# Patient Record
Sex: Male | Born: 1975 | Race: Black or African American | Hispanic: No | Marital: Single | Smoking: Former smoker
Health system: Southern US, Community
[De-identification: ages and names within clinical notes are randomized; demographics above are authoritative.]

## PROBLEM LIST (undated history)

## (undated) DIAGNOSIS — G4730 Sleep apnea, unspecified: Secondary | ICD-10-CM

## (undated) DIAGNOSIS — E785 Hyperlipidemia, unspecified: Secondary | ICD-10-CM

## (undated) DIAGNOSIS — K219 Gastro-esophageal reflux disease without esophagitis: Secondary | ICD-10-CM

## (undated) DIAGNOSIS — T7840XA Allergy, unspecified, initial encounter: Secondary | ICD-10-CM

## (undated) DIAGNOSIS — IMO0001 Reserved for inherently not codable concepts without codable children: Secondary | ICD-10-CM

## (undated) DIAGNOSIS — I10 Essential (primary) hypertension: Secondary | ICD-10-CM

## (undated) DIAGNOSIS — M1990 Unspecified osteoarthritis, unspecified site: Secondary | ICD-10-CM

## (undated) DIAGNOSIS — Z862 Personal history of diseases of the blood and blood-forming organs and certain disorders involving the immune mechanism: Secondary | ICD-10-CM

## (undated) DIAGNOSIS — R0602 Shortness of breath: Secondary | ICD-10-CM

## (undated) DIAGNOSIS — F419 Anxiety disorder, unspecified: Secondary | ICD-10-CM

## (undated) DIAGNOSIS — R51 Headache: Secondary | ICD-10-CM

## (undated) DIAGNOSIS — F329 Major depressive disorder, single episode, unspecified: Secondary | ICD-10-CM

## (undated) DIAGNOSIS — F32A Depression, unspecified: Secondary | ICD-10-CM

## (undated) HISTORY — DX: Essential (primary) hypertension: I10

## (undated) HISTORY — DX: Hyperlipidemia, unspecified: E78.5

## (undated) HISTORY — DX: Reserved for inherently not codable concepts without codable children: IMO0001

## (undated) HISTORY — DX: Major depressive disorder, single episode, unspecified: F32.9

## (undated) HISTORY — DX: Gastro-esophageal reflux disease without esophagitis: K21.9

## (undated) HISTORY — DX: Depression, unspecified: F32.A

## (undated) HISTORY — DX: Allergy, unspecified, initial encounter: T78.40XA

---

## 1999-03-26 ENCOUNTER — Emergency Department (HOSPITAL_COMMUNITY): Admission: EM | Admit: 1999-03-26 | Discharge: 1999-03-26 | Payer: Self-pay | Admitting: *Deleted

## 1999-03-27 ENCOUNTER — Encounter: Payer: Self-pay | Admitting: *Deleted

## 2000-10-31 ENCOUNTER — Encounter: Payer: Self-pay | Admitting: Emergency Medicine

## 2000-10-31 ENCOUNTER — Emergency Department (HOSPITAL_COMMUNITY): Admission: EM | Admit: 2000-10-31 | Discharge: 2000-10-31 | Payer: Self-pay | Admitting: Emergency Medicine

## 2004-06-19 ENCOUNTER — Emergency Department (HOSPITAL_COMMUNITY): Admission: EM | Admit: 2004-06-19 | Discharge: 2004-06-19 | Payer: Self-pay | Admitting: Family Medicine

## 2006-01-29 ENCOUNTER — Emergency Department (HOSPITAL_COMMUNITY): Admission: EM | Admit: 2006-01-29 | Discharge: 2006-01-29 | Payer: Self-pay | Admitting: Emergency Medicine

## 2006-04-10 ENCOUNTER — Emergency Department (HOSPITAL_COMMUNITY): Admission: EM | Admit: 2006-04-10 | Discharge: 2006-04-10 | Payer: Self-pay | Admitting: Emergency Medicine

## 2007-02-19 ENCOUNTER — Emergency Department (HOSPITAL_COMMUNITY): Admission: EM | Admit: 2007-02-19 | Discharge: 2007-02-19 | Payer: Self-pay | Admitting: Family Medicine

## 2007-04-11 ENCOUNTER — Emergency Department (HOSPITAL_COMMUNITY): Admission: EM | Admit: 2007-04-11 | Discharge: 2007-04-11 | Payer: Self-pay | Admitting: Emergency Medicine

## 2008-09-01 ENCOUNTER — Emergency Department (HOSPITAL_COMMUNITY): Admission: EM | Admit: 2008-09-01 | Discharge: 2008-09-01 | Payer: Self-pay | Admitting: Family Medicine

## 2009-10-09 ENCOUNTER — Ambulatory Visit: Payer: Self-pay | Admitting: Internal Medicine

## 2009-10-09 ENCOUNTER — Observation Stay (HOSPITAL_COMMUNITY): Admission: EM | Admit: 2009-10-09 | Discharge: 2009-10-10 | Payer: Self-pay | Admitting: Emergency Medicine

## 2009-10-14 ENCOUNTER — Encounter: Payer: Self-pay | Admitting: Ophthalmology

## 2009-10-14 ENCOUNTER — Ambulatory Visit: Payer: Self-pay | Admitting: Internal Medicine

## 2009-10-14 DIAGNOSIS — Z862 Personal history of diseases of the blood and blood-forming organs and certain disorders involving the immune mechanism: Secondary | ICD-10-CM | POA: Insufficient documentation

## 2009-10-14 DIAGNOSIS — Z8639 Personal history of other endocrine, nutritional and metabolic disease: Secondary | ICD-10-CM

## 2009-10-14 DIAGNOSIS — R0789 Other chest pain: Secondary | ICD-10-CM | POA: Insufficient documentation

## 2009-10-14 DIAGNOSIS — K219 Gastro-esophageal reflux disease without esophagitis: Secondary | ICD-10-CM

## 2009-10-14 DIAGNOSIS — F1910 Other psychoactive substance abuse, uncomplicated: Secondary | ICD-10-CM | POA: Insufficient documentation

## 2009-10-14 LAB — CONVERTED CEMR LAB
Calcium: 9.7 mg/dL (ref 8.4–10.5)
Glucose, Bld: 91 mg/dL (ref 70–99)
Sodium: 141 meq/L (ref 135–145)

## 2010-03-04 NOTE — Assessment & Plan Note (Signed)
Summary: HFU/DS   Vital Signs:  Patient profile:   35 year old male Height:      71 inches (180.34 cm) Weight:      189.3 pounds (86.05 kg) BMI:     26.50 Temp:     98.7 degrees F (37.06 degrees C) oral Pulse rate:   65 / minute BP sitting:   131 / 81  (right arm) Cuff size:   regular  Vitals Entered By: Theotis Barrio NT II (October 14, 2009 3:54 PM) CC: ADD ON-ER FOLLOW UP/ NKA Is Patient Diabetic? No Pain Assessment Patient in pain? no       Have you ever been in a relationship where you felt threatened, hurt or afraid?No   Does patient need assistance? Functional Status Self care Ambulation Normal   CC:  ADD ON-ER FOLLOW UP/ NKA.  History of Present Illness: This is a 35 year old male recently hospitalized (10/09/09) for chest pain.  He has had not further recurrence of chest pain.  Pt is now complaining of irritation in his throat that makes him cough. Pt states that symptoms have been occurring for three years. Pt gets sensation to cough and he occasionally coughs up white sputum tinged with blood. This happens almost daily.  Worse after he eats and at night when he lays down.  This is associated with heart burn.  These symptoms are associated with nausea and pt vomits a few times per week.  Pt states that there are streaks of blood in vomit.   Pt states that he drinks 2-3 beers 2-3 times weekly but will occasionally drink up to a 6 pack.  Pt states that he is taking prilosec daily and is not missing doses.  At this visit we discussed the patients alcohol use and the importance of quitting.   Preventive Screening-Counseling & Management  Alcohol-Tobacco     Alcohol drinks/day: 2     Alcohol type: beer     Smoking Status: current     Packs/Day: 2-3 CIGARETTES  Caffeine-Diet-Exercise     Does Patient Exercise: yes     Type of exercise: WEIGHT LIFTING     Times/week: <3  Allergies (verified): No Known Drug Allergies  Family History: Dad had pancreatic  cancer Brother with CAD had MI at 73's Mother had aneurism at 54  Social History: Lives with significant other. Has 4 children. Smokes 2-3 cigs daily. Drings 2-3 beverages 2-3 times weekly. No illigal drug use now. Smoking Status:  current Packs/Day:  2-3 CIGARETTES Does Patient Exercise:  yes  Review of Systems       No change in BM's, no abdominal pain, no chest pain, no SoB, no fevers or chills.   Physical Exam  General:  alert and well-developed.   Head:  normocephalic and atraumatic.   Eyes:  vision grossly intact, pupils equal, and pupils round.   Ears:  R ear normal and L ear normal.   Nose:  no external deformity and no nasal discharge.   Mouth:  good dentition and pharynx pink and moist.  Mild erythema in posterior pharynx Neck:  supple and full ROM.   Chest Wall:  no deformities and no tenderness.   Lungs:  normal respiratory effort, normal breath sounds, no dullness, no fremitus, no crackles, and no wheezes.   Heart:  normal rate, regular rhythm, no murmur, no gallop, and no rub.   Abdomen:  soft, non-tender, normal bowel sounds, and no distention.   Msk:  normal ROM, no  joint tenderness, and no joint swelling.   Pulses:  +2 radial and dp/pt pulses bilaterally Extremities:  No edema Neurologic:  alert & oriented X3 and cranial nerves II-XII intact.   Skin:  no rashes and no edema.   Cervical Nodes:  no cervical lymphadanopathy   Impression & Recommendations:  Problem # 1:  Hosp for CHEST PAIN, ATYPICAL (ICD-786.59) Was felt to be most consistent with GERD/alcohol use.  Pt has hx of GERD and had not been taking his medications regularly at the time of hospitalization.  On discharge pt was told to start Zantac OTC and has been taking prilosec daily since that point.  At this point the chest pain has resolved, but we will continue to monitor.  Problem # 2:  Hx of GERD (ICD-530.81) Pt was instructed on the importance of regular use of his medications. He was instructed  on lifestyle modifications to help in the control of GERD symptoms such as elevation of the head of his bed, remaining upright for 3 hours after meals, decreasing alcohol, cigarette, and caffeine use, and avoidance of spicy or acidic foods.  The patients history of coughing up blood streaked sputum is likely the result of his uncontrolled gerd.  I increased his dose of OTC prilosec from 1 20mg  tab daily to two.  The patient will follow up with his PCP in 1 month for FU.  If the patient remains adherent to his medication use, and symptoms do not improve, I would consider a GI consult.  The pt was instructed to call the [facility] or go to the ED if symptoms get worse.   I also discussed the importance of not drinking alcohol because of the risk of alcoholic gastritis and worsening of his GERD symptoms.   His updated medication list for this problem includes:    Prilosec Otc 20 Mg Tbec (Omeprazole magnesium) .Marland Kitchen... Take two tablets once daily.  Problem # 3:  HYPOKALEMIA, HX OF (ICD-V12.2) Rechecking BMET today.   Orders: T-Basic Metabolic Panel ([id number])  Problem # 4:  Hx of SUBSTANCE ABUSE, MULTIPLE (ICD-305.90) Pt currently denies any illegal drug use and was counseled on the negative effects of both the use of illicit drugs and of alcohol.  Complete Medication List: 1)  Prilosec Otc 20 Mg Tbec (Omeprazole magnesium) .... Take two tablets once daily. 2)  Multivitamins Tabs (Multiple vitamin) .... Take 1 tablet by mouth once a day  Patient Instructions: 1)  Please take two tablets of your over the counter prilosec once a day.  Do not miss any doses.  If your symptoms get worse or you begin to cough up more than blood streaks, please call the outpatient clinic or go to the ER.  You will follow up in 1 month.  Process Orders Check Orders Results:     [facility]: ABN not required for this insurance Tests Sent for requisitioning (October 14, 2009 5:09 PM):     10/14/2009:  [facility] -- T-Basic Metabolic Panel [phone] (signed)

## 2010-03-04 NOTE — Miscellaneous (Signed)
Summary: PATIENT CONSENT FORM  PATIENT CONSENT FORM   Imported By: Louretta Parma 10/15/2009 11:30:36  _____________________________________________________________________  External Attachment:    Type:   Image     Comment:   External Document

## 2010-04-15 LAB — COMPREHENSIVE METABOLIC PANEL
ALT: 21 U/L (ref 0–53)
Alkaline Phosphatase: 68 U/L (ref 39–117)
BUN: 8 mg/dL (ref 6–23)
CO2: 21 mEq/L (ref 19–32)
Chloride: 104 mEq/L (ref 96–112)
GFR calc non Af Amer: >60 mL/min (ref >60)
Glucose, Bld: 132 mg/dL — ABNORMAL HIGH (ref 70–99)
Potassium: 2.9 mEq/L — ABNORMAL LOW (ref 3.5–5.1)
Total Bilirubin: 0.3 mg/dL (ref 0.3–1.2)
Total Protein: 7.8 g/dL (ref 6.0–8.3)

## 2010-04-15 LAB — BASIC METABOLIC PANEL
BUN: 10 mg/dL (ref 6–23)
CO2: 30 mEq/L (ref 19–32)
Chloride: 103 mEq/L (ref 96–112)
Creatinine, Ser: 1.24 mg/dL (ref 0.4–1.5)
Potassium: 4 mEq/L (ref 3.5–5.1)

## 2010-04-15 LAB — DIFFERENTIAL
Eosinophils Absolute: 0.1 10*3/uL (ref 0.0–0.7)
Lymphs Abs: 4 10*3/uL (ref 0.7–4.0)
Monocytes Absolute: 1.1 10*3/uL — ABNORMAL HIGH (ref 0.1–1.0)
Neutrophils Relative %: 56 % (ref 43–77)

## 2010-04-15 LAB — POCT CARDIAC MARKERS
CKMB, poc: <1 ng/mL — ABNORMAL LOW (ref 1.0–8.0)
CKMB, poc: <1 ng/mL — ABNORMAL LOW (ref 1.0–8.0)
CKMB, poc: <1 ng/mL — ABNORMAL LOW (ref 1.0–8.0)
CKMB, poc: <1 ng/mL — ABNORMAL LOW (ref 1.0–8.0)
Myoglobin, poc: 118 ng/mL (ref 12–200)
Myoglobin, poc: 121 ng/mL (ref 12–200)
Myoglobin, poc: 134 ng/mL (ref 12–200)
Myoglobin, poc: 83.4 ng/mL (ref 12–200)
Troponin i, poc: <0.05 ng/mL (ref 0.00–0.09)
Troponin i, poc: <0.05 ng/mL (ref 0.00–0.09)
Troponin i, poc: <0.05 ng/mL (ref 0.00–0.09)
Troponin i, poc: <0.05 ng/mL (ref 0.00–0.09)

## 2010-04-15 LAB — URINALYSIS, ROUTINE W REFLEX MICROSCOPIC
Ketones, ur: NEGATIVE mg/dL
Nitrite: NEGATIVE
Specific Gravity, Urine: 1.006 (ref 1.005–1.030)
Urobilinogen, UA: 0.2 mg/dL (ref 0.0–1.0)
pH: 5.5 (ref 5.0–8.0)

## 2010-04-15 LAB — ETHANOL

## 2010-04-15 LAB — LIPID PANEL: HDL: 56 mg/dL (ref >39)

## 2010-04-15 LAB — LIPASE, BLOOD: Lipase: 20 U/L (ref 11–59)

## 2010-04-15 LAB — CARDIAC PANEL(CRET KIN+CKTOT+MB+TROPI)
Relative Index: 0.3 (ref 0.0–2.5)
Total CK: 494 U/L — ABNORMAL HIGH (ref 7–232)

## 2010-04-15 LAB — CBC
HCT: 39.6 % (ref 39.0–52.0)
Hemoglobin: 13.1 g/dL (ref 13.0–17.0)
MCHC: 33.1 g/dL (ref 30.0–36.0)
RBC: 6.16 MIL/uL — ABNORMAL HIGH (ref 4.22–5.81)

## 2010-04-15 LAB — RAPID URINE DRUG SCREEN, HOSP PERFORMED: Benzodiazepines: NOT DETECTED

## 2010-04-15 LAB — TSH: TSH: 2.3 u[IU]/mL (ref 0.350–4.500)

## 2012-06-28 ENCOUNTER — Encounter: Payer: Self-pay | Admitting: Internal Medicine

## 2012-06-28 ENCOUNTER — Ambulatory Visit: Payer: No Typology Code available for payment source | Attending: Internal Medicine | Admitting: Internal Medicine

## 2012-06-28 ENCOUNTER — Telehealth: Payer: Self-pay | Admitting: *Deleted

## 2012-06-28 VITALS — BP 144/99 | HR 85 | Temp 98.5°F | Resp 18 | Ht 69.5 in | Wt 180.0 lb

## 2012-06-28 DIAGNOSIS — M5137 Other intervertebral disc degeneration, lumbosacral region: Secondary | ICD-10-CM | POA: Insufficient documentation

## 2012-06-28 DIAGNOSIS — K219 Gastro-esophageal reflux disease without esophagitis: Secondary | ICD-10-CM

## 2012-06-28 DIAGNOSIS — I10 Essential (primary) hypertension: Secondary | ICD-10-CM

## 2012-06-28 DIAGNOSIS — Z0000 Encounter for general adult medical examination without abnormal findings: Secondary | ICD-10-CM | POA: Insufficient documentation

## 2012-06-28 DIAGNOSIS — F17200 Nicotine dependence, unspecified, uncomplicated: Secondary | ICD-10-CM | POA: Insufficient documentation

## 2012-06-28 DIAGNOSIS — Z79899 Other long term (current) drug therapy: Secondary | ICD-10-CM | POA: Insufficient documentation

## 2012-06-28 DIAGNOSIS — M549 Dorsalgia, unspecified: Secondary | ICD-10-CM

## 2012-06-28 DIAGNOSIS — F3289 Other specified depressive episodes: Secondary | ICD-10-CM

## 2012-06-28 DIAGNOSIS — G8929 Other chronic pain: Secondary | ICD-10-CM | POA: Insufficient documentation

## 2012-06-28 DIAGNOSIS — F329 Major depressive disorder, single episode, unspecified: Secondary | ICD-10-CM | POA: Insufficient documentation

## 2012-06-28 DIAGNOSIS — M51379 Other intervertebral disc degeneration, lumbosacral region without mention of lumbar back pain or lower extremity pain: Secondary | ICD-10-CM | POA: Insufficient documentation

## 2012-06-28 MED ORDER — AMLODIPINE BESYLATE 5 MG PO TABS: 5.0000 mg | ORAL_TABLET | Freq: Every day | ORAL | Status: DC

## 2012-06-28 MED ORDER — OMEPRAZOLE 20 MG PO CPDR: 20.0000 mg | DELAYED_RELEASE_CAPSULE | Freq: Every day | ORAL | Status: DC

## 2012-06-28 MED ORDER — GABAPENTIN 300 MG PO CAPS: 300.0000 mg | ORAL_CAPSULE | Freq: Three times a day (TID) | ORAL | Status: DC

## 2012-06-28 NOTE — Telephone Encounter (Signed)
Patient has orders for MRI Lumbar.

## 2012-06-28 NOTE — Progress Notes (Signed)
Patient states he has been diagnosed with hypertension, depression, a "back disorder", and acid reflux.

## 2012-06-28 NOTE — Progress Notes (Signed)
Patient ID: Christopher Mcintyre, male   DOB: 1975-11-19, 37 y.o.   MRN: [id number] Patient Demographics  Christopher Mcintyre, is a 37 y.o. male  WUJ:[id number]  [id number]  DOB - [birth year]  Chief Complaint  Patient presents with  . Establish Care    patient presents to establish care; needs hypertension med refills.  . Hypertension        Subjective:   Christopher Mcintyre today is here to establish primary care. Patient has a  Past Medical History  Diagnosis Date  . Hypertension   . Allergy   . Depression   . Reflux   . Patient has had chronic back pain for the past 3-4 years, he has had numerous x-rays in the past which shows degenerative joint disease he did he continues to have back pain. He's not taken any antihypertensive medications or anti-psychiatric medications for the past few years as well. His primary purpose of today's visit is to establish care. He previously used to see a Therapist, sports at [employer]. He currently denies any suicidal or homicidal ideations. Patient has also has No headache, No chest pain, No abdominal pain,No Nausea, No new weakness tingling or numbness, No Cough or SOB. No history of urinary incontinence, weakness.  Objective:    Filed Vitals:   06/28/12 1331  BP: 144/99  Pulse: 85  Temp: 98.5 F (36.9 C)  TempSrc: Oral  Resp: 18  Height: 5' 9.5" (1.765 m)  Weight: 180 lb (81.647 kg)  SpO2: 98%     ALLERGIES:  No Known Allergies  PAST MEDICAL HISTORY: Past Medical History  Diagnosis Date  . Hypertension   . Allergy   . Depression   . Reflux     PAST SURGICAL HISTORY: History reviewed. No pertinent past surgical history.  FAMILY HISTORY: Family History  Problem Relation Age of Onset  . Cancer Mother   . Diabetes Mother   . Heart disease Mother   . Hyperlipidemia Mother   . Cancer Father   . Diabetes Father   . Heart disease Father   . Hyperlipidemia Father     MEDICATIONS AT HOME: Prior to Admission medications   Medication  Sig Start Date End Date Taking? Authorizing Provider  gabapentin (NEURONTIN) 300 MG capsule Take 1 capsule (300 mg total) by mouth 3 (three) times daily. 06/28/12  Yes Ziere Docken Levora Dredge, MD  hydrochlorothiazide (HYDRODIURIL) 25 MG tablet Take 25 mg by mouth daily.   Yes Historical Provider, MD  HYDROcodone-acetaminophen (VICODIN) 5-500 MG per tablet Take 1 tablet by mouth every 6 (six) hours as needed for pain.   Yes Historical Provider, MD  omeprazole (PRILOSEC) 20 MG capsule Take 1 capsule (20 mg total) by mouth daily. 06/28/12  Yes Christopher Mcintyre Levora Dredge, MD  QUEtiapine (SEROQUEL) 100 MG tablet Take 100 mg by mouth at bedtime.   Yes Historical Provider, MD  sertraline (ZOLOFT) 100 MG tablet Take 100 mg by mouth daily.   Yes Historical Provider, MD  traMADol (ULTRAM) 50 MG tablet Take 50 mg by mouth every 6 (six) hours as needed for pain.   Yes Historical Provider, MD  traZODone (DESYREL) 100 MG tablet Take 100 mg by mouth at bedtime.   Yes Historical Provider, MD  amLODipine (NORVASC) 5 MG tablet Take 1 tablet (5 mg total) by mouth daily. 06/28/12   Zayah Keilman Levora Dredge, MD    SOCIAL HISTORY:   reports that he has been smoking Cigarettes.  He has been smoking about 0.25 packs per day. He does not have  any smokeless tobacco history on file. He reports that he does not drink alcohol or use illicit drugs.  REVIEW OF SYSTEMS:  Constitutional:   No   Fevers, chills, fatigue.  HEENT:    No headaches, Sore throat,   Cardio-vascular: No chest pain,  Orthopnea, swelling in lower extremities, anasarca, palpitations  GI:  No abdominal pain, nausea, vomiting, diarrhea  Resp: No shortness of breath,  No coughing up of blood.No cough.No wheezing.  Skin:  no rash or lesions.  GU:  no dysuria, change in color of urine, no urgency or frequency.  No flank pain.  Musculoskeletal: No joint pain or swelling.  No decreased range of motion.    Psych: No change in mood or affect. No depression or anxiety.   No memory loss.   Exam  General appearance :Awake, alert, not in any distress. Speech Clear. Not toxic Looking HEENT: Atraumatic and Normocephalic, pupils equally reactive to light and accomodation Neck: supple, no JVD. No cervical lymphadenopathy.  Chest:Good air entry bilaterally, no added sounds  CVS: S1 S2 regular, no murmurs.  Abdomen: Bowel sounds present, Non tender and not distended with no gaurding, rigidity or rebound. Extremities: B/L Lower Ext shows no edema, both legs are warm to touch Neurology: Awake alert, and oriented X 3, CN II-XII intact, Non focal Skin:No Rash Wounds:N/A    Data Review   CBC No results found for this basename: WBC, HGB, HCT, PLT, MCV, MCH, MCHC, RDW, NEUTRABS, LYMPHSABS, MONOABS, EOSABS, BASOSABS, BANDABS, BANDSABD,  in the last 168 hours  Chemistries   No results found for this basename: NA, K, CL, CO2, GLUCOSE, BUN, CREATININE, GFRCGP, CALCIUM, MG, AST, ALT, ALKPHOS, BILITOT,  in the last 168 hours ------------------------------------------------------------------------------------------------------------------ No results found for this basename: HGBA1C,  in the last 72 hours ------------------------------------------------------------------------------------------------------------------ No results found for this basename: CHOL, HDL, LDLCALC, TRIG, CHOLHDL, LDLDIRECT,  in the last 72 hours ------------------------------------------------------------------------------------------------------------------ No results found for this basename: TSH, T4TOTAL, FREET3, T3FREE, THYROIDAB,  in the last 72 hours ------------------------------------------------------------------------------------------------------------------ No results found for this basename: VITAMINB12, FOLATE, FERRITIN, TIBC, IRON, RETICCTPCT,  in the last 72 hours  Coagulation profile  No results found for this basename: INR, PROTIME,  in the last 168 hours    Assessment & Plan    #1 hypertension - Start amlodipine 5 mg daily - Followup blood pressure in one month  #2 gastroesophageal reflux disease - Start omeprazole- monitor and follow clinically  #3 depression - Referral to psychiatry - Defer starting any psych meds for psych-we'll also check a urinary drug screen as well - Reassess next visit - Currently stable without any suicidal or homicidal ideation  #4 chronic back pain - And long-standing history and prior x-rays that he has with him which show degenerative disc disease- check MRI of the lumbosacral spine - Explained to him that, we will not do chronic pain management clinic - Follow MRI and monitor clinically - Exam is reassuring with 5/5 strength all over, no history of urinary incontinence, fever weakness -? Prior history of drug use-check urinary drug screen  We'll check CBC, CMET, A1c and lipid panel and bring back in one month Please check MRI during next visit as well

## 2012-07-05 ENCOUNTER — Ambulatory Visit: Payer: No Typology Code available for payment source | Attending: Family Medicine

## 2012-07-05 DIAGNOSIS — M549 Dorsalgia, unspecified: Secondary | ICD-10-CM

## 2012-07-05 DIAGNOSIS — I10 Essential (primary) hypertension: Secondary | ICD-10-CM

## 2012-07-05 LAB — LIPID PANEL
Cholesterol: 149 mg/dL (ref 0–200)
HDL: 55 mg/dL (ref >39)
Total CHOL/HDL Ratio: 2.7 Ratio
Triglycerides: 73 mg/dL (ref <150)
VLDL: 15 mg/dL (ref 0–40)

## 2012-07-05 LAB — COMPREHENSIVE METABOLIC PANEL
Alkaline Phosphatase: 74 U/L (ref 39–117)
CO2: 23 mEq/L (ref 19–32)
Creat: 1.11 mg/dL (ref 0.50–1.35)
Glucose, Bld: 96 mg/dL (ref 70–99)
Sodium: 135 mEq/L (ref 135–145)
Total Bilirubin: 0.3 mg/dL (ref 0.3–1.2)
Total Protein: 6.7 g/dL (ref 6.0–8.3)

## 2012-07-05 LAB — CBC
HCT: 37.1 % — ABNORMAL LOW (ref 39.0–52.0)
MCHC: 31.5 g/dL (ref 30.0–36.0)
MCV: 61.1 fL — ABNORMAL LOW (ref 78.0–100.0)
Platelets: 269 10*3/uL (ref 150–400)
RDW: 21.7 % — ABNORMAL HIGH (ref 11.5–15.5)
WBC: 5.8 10*3/uL (ref 4.0–10.5)

## 2012-07-05 LAB — TSH: TSH: 1.726 u[IU]/mL (ref 0.350–4.500)

## 2012-07-10 ENCOUNTER — Telehealth: Payer: Self-pay | Admitting: Family Medicine

## 2012-07-10 NOTE — Telephone Encounter (Signed)
Pt having side effects from amLODipine (NORVASC) 5 MG tablet.  Skin breaking out in rashes, no energy.

## 2012-07-10 NOTE — Telephone Encounter (Signed)
07/10/12 Spoke with patient via telephone stated his skin is is breaking out  With a rash x 1week. Itching on legs and arms at intervals also feeling weak but  Can function. No other symptoms. Patient stated  He did not think he needed to be seen  At clinic just wanted to make MD aware.  P.Tinnie Kunin,RN BSN MHA

## 2012-07-10 NOTE — Telephone Encounter (Signed)
If he is having a rash along with systemic sx of feeling weak, he probably should be evaluated in the clinic. We may need to change a medication if he is allergic, and his reaction may need treatment itself.

## 2012-07-11 ENCOUNTER — Telehealth: Payer: Self-pay | Admitting: *Deleted

## 2012-07-11 NOTE — Telephone Encounter (Signed)
07/11/12 Patient schedule for an appointment on Monday 07/16/12 to  Follow up rash. P.Lekia Nier,RN BSN MHA

## 2012-07-12 ENCOUNTER — Ambulatory Visit (HOSPITAL_COMMUNITY)
Admission: RE | Admit: 2012-07-12 | Discharge: 2012-07-12 | Disposition: A | Discharge status: Home / Self Care | Payer: No Typology Code available for payment source | Source: Ambulatory Visit | Attending: Internal Medicine | Admitting: Internal Medicine

## 2012-07-12 DIAGNOSIS — M51379 Other intervertebral disc degeneration, lumbosacral region without mention of lumbar back pain or lower extremity pain: Secondary | ICD-10-CM | POA: Insufficient documentation

## 2012-07-12 DIAGNOSIS — M5146 Schmorl's nodes, lumbar region: Secondary | ICD-10-CM | POA: Insufficient documentation

## 2012-07-12 DIAGNOSIS — I10 Essential (primary) hypertension: Secondary | ICD-10-CM

## 2012-07-12 DIAGNOSIS — M5126 Other intervertebral disc displacement, lumbar region: Secondary | ICD-10-CM | POA: Insufficient documentation

## 2012-07-12 DIAGNOSIS — M549 Dorsalgia, unspecified: Secondary | ICD-10-CM

## 2012-07-12 DIAGNOSIS — M5137 Other intervertebral disc degeneration, lumbosacral region: Secondary | ICD-10-CM | POA: Insufficient documentation

## 2012-07-16 ENCOUNTER — Ambulatory Visit: Payer: No Typology Code available for payment source | Attending: Family Medicine | Admitting: Family Medicine

## 2012-07-16 VITALS — BP 150/93 | HR 72 | Temp 97.9°F | Resp 16 | Ht 69.0 in | Wt 180.0 lb

## 2012-07-16 DIAGNOSIS — M549 Dorsalgia, unspecified: Secondary | ICD-10-CM

## 2012-07-16 DIAGNOSIS — R21 Rash and other nonspecific skin eruption: Secondary | ICD-10-CM | POA: Insufficient documentation

## 2012-07-16 DIAGNOSIS — I10 Essential (primary) hypertension: Secondary | ICD-10-CM

## 2012-07-16 MED ORDER — GABAPENTIN 300 MG PO CAPS: 300.0000 mg | ORAL_CAPSULE | Freq: Every day | ORAL | Status: DC

## 2012-07-16 MED ORDER — LISINOPRIL-HYDROCHLOROTHIAZIDE 10-12.5 MG PO TABS: 1.0000 | ORAL_TABLET | Freq: Every day | ORAL | Status: DC

## 2012-07-16 NOTE — Progress Notes (Signed)
Patient states since he started taking  norvasc he broke out in a rash on his arms and back of knees

## 2012-07-16 NOTE — Patient Instructions (Signed)

## 2012-07-16 NOTE — Progress Notes (Signed)
Subjective:     Patient ID: Christopher Mcintyre, male   DOB: 1975/06/04, 37 y.o.   MRN: [id number]  HPI Pt here with itchy rash on b/l arms since starting amlodipine a few weeks ago. It was also on the backs of his legs but that has resolved and the rash has greatly improved since last Friday when he stopped the amlodipine of his own accord. He has not had any respiratory sx or the feeling like his throat was swelling. He notes that in the past he took hctz but has not been on it since starting the amlodipine.   He also notes that he was given a prescription for neurontin 300mg  TID and took it to the health dept where he was told it would be too expensive.     Review of Systems per hpi     Objective:   Physical Exam  Nursing note and vitals reviewed. Constitutional: He appears well-developed and well-nourished.  Cardiovascular: Normal rate and regular rhythm.   Pulmonary/Chest: Effort normal and breath sounds normal.  Skin: Skin is warm and dry. Rash noted.  Diffuse, slightly bumpy, slightly erythematous rash on posterior side of lower arms       Assessment:     Essential hypertension, benign - Plan: lisinopril-hydrochlorothiazide (PRINZIDE,ZESTORETIC) 10-12.5 MG per tablet  Back pain - Plan: gabapentin (NEURONTIN) 300 MG capsule       Plan:     Rash - agree with d/c amlodipine. Will list under allergies. Will start zestoretic. Ok to take benadryl/claritin or use topical hydrocortsone otc for the itching until the rash clears up.   Back pain/neurontin too expensive - as far as I can tell, the least expensive place to get it is at [facility]. Discussed with him that neurontin is a very good drug for him and i think he really should give it a try. Unfortunately at [facility] only 300mg /day is included in the discount price. While not ideal, this is at least worth a try. Have given him a new script with this dosing.   F/u as scheduled for chronic issues or if rash worsens.

## 2012-07-19 ENCOUNTER — Telehealth: Payer: Self-pay | Admitting: Family Medicine

## 2012-07-24 ENCOUNTER — Telehealth: Payer: Self-pay | Admitting: *Deleted

## 2012-07-24 NOTE — Telephone Encounter (Signed)
07/24/12 Patient will be in on 07/30/12 for follow up appointment will discuss  MRI results with doctor. P.Kaiyla Stahly,RN BSN MHA

## 2012-07-30 ENCOUNTER — Telehealth: Payer: Self-pay | Admitting: Family Medicine

## 2012-07-30 ENCOUNTER — Encounter: Payer: Self-pay | Admitting: Internal Medicine

## 2012-07-30 ENCOUNTER — Ambulatory Visit: Payer: No Typology Code available for payment source | Attending: Family Medicine | Admitting: Internal Medicine

## 2012-07-30 VITALS — BP 160/108 | HR 76 | Temp 98.5°F | Ht 71.0 in | Wt 181.2 lb

## 2012-07-30 DIAGNOSIS — I10 Essential (primary) hypertension: Secondary | ICD-10-CM

## 2012-07-30 DIAGNOSIS — M5126 Other intervertebral disc displacement, lumbar region: Secondary | ICD-10-CM

## 2012-07-30 DIAGNOSIS — M5127 Other intervertebral disc displacement, lumbosacral region: Secondary | ICD-10-CM

## 2012-07-30 DIAGNOSIS — K922 Gastrointestinal hemorrhage, unspecified: Secondary | ICD-10-CM

## 2012-07-30 MED ORDER — LISINOPRIL-HYDROCHLOROTHIAZIDE 20-25 MG PO TABS: 1.0000 | ORAL_TABLET | Freq: Every day | ORAL | Status: DC

## 2012-07-30 MED ORDER — OMEPRAZOLE 20 MG PO CPDR: 40.0000 mg | DELAYED_RELEASE_CAPSULE | Freq: Two times a day (BID) | ORAL | Status: DC

## 2012-07-30 MED ORDER — FLUTICASONE PROPIONATE 50 MCG/ACT NA SUSP: 2.0000 | Freq: Every day | NASAL | Status: DC

## 2012-07-30 NOTE — Telephone Encounter (Signed)
Pt needs Dr to write note stating pt's condition for disability claim. Pt's lawyer told pt that if he has written confirmation of medical condition this would facilitate claim.

## 2012-07-30 NOTE — Telephone Encounter (Signed)
Ask Dr. Butler Denmark her thoughts on this.  She saw him today.

## 2012-07-30 NOTE — Patient Instructions (Addendum)
For the problem with vomiting blood, avoid aspirin Naprosyn and ibuprofen strictly. Do not drink any alcohol or take any caffeine. If vomiting occurs and does not stop he will need to go to the ER immediately. Please take Prilosec 40 mg twice a day as prescribed.   Hypertension As your heart beats, it forces blood through your arteries. This force is your blood pressure. If the pressure is too high, it is called hypertension (HTN) or high blood pressure. HTN is dangerous because you may have it and not know it. High blood pressure may mean that your heart has to work harder to pump blood. Your arteries may be narrow or stiff. The extra work puts you at risk for heart disease, stroke, and other problems.  Blood pressure consists of two numbers, a higher number over a lower, 110/72, for example. It is stated as "110 over 72." The ideal is below 120 for the top number (systolic) and under 80 for the bottom (diastolic). Write down your blood pressure today. You should pay close attention to your blood pressure if you have certain conditions such as:  Heart failure.  Prior heart attack.  Diabetes  Chronic kidney disease.  Prior stroke.  Multiple risk factors for heart disease. To see if you have HTN, your blood pressure should be measured while you are seated with your arm held at the level of the heart. It should be measured at least twice. A one-time elevated blood pressure reading (especially in the Emergency Department) does not mean that you need treatment. There may be conditions in which the blood pressure is different between your right and left arms. It is important to see your caregiver soon for a recheck. Most people have essential hypertension which means that there is not a specific cause. This type of high blood pressure may be lowered by changing lifestyle factors such as:  Stress.  Smoking.  Lack of exercise.  Excessive weight.  Drug/tobacco/alcohol use.  Eating less  salt. Most people do not have symptoms from high blood pressure until it has caused damage to the body. Effective treatment can often prevent, delay or reduce that damage. TREATMENT  When a cause has been identified, treatment for high blood pressure is directed at the cause. There are a large number of medications to treat HTN. These fall into several categories, and your caregiver will help you select the medicines that are best for you. Medications may have side effects. You should review side effects with your caregiver. If your blood pressure stays high after you have made lifestyle changes or started on medicines,   Your medication(s) may need to be changed.  Other problems may need to be addressed.  Be certain you understand your prescriptions, and know how and when to take your medicine.  Be sure to follow up with your caregiver within the time frame advised (usually within two weeks) to have your blood pressure rechecked and to review your medications.  If you are taking more than one medicine to lower your blood pressure, make sure you know how and at what times they should be taken. Taking two medicines at the same time can result in blood pressure that is too low. SEEK IMMEDIATE MEDICAL CARE IF:  You develop a severe headache, blurred or changing vision, or confusion.  You have unusual weakness or numbness, or a faint feeling.  You have severe chest or abdominal pain, vomiting, or breathing problems. MAKE SURE YOU:   Understand these instructions.  Will  watch your condition.  Will get help right away if you are not doing well or get worse. Document Released: 01/17/2005 Document Revised: 04/11/2011 Document Reviewed: 09/07/2007 [facility] Patient Information 2014 [place], [place].

## 2012-07-30 NOTE — Progress Notes (Signed)
Patient ID: Christopher Mcintyre, male   DOB: Mar 13, 1975, 37 y.o.   MRN: [id number]  CC:  HPI:  This is a 37 year old male who presents to the clinic for followup of MRI and blood work. MRI was obtained secondary to a complaint of back pain. The patient was prescribed Neurontin and states that it does work for him when he has the medication. He currently does not have it and is going to pick up his prescriptions today. He states his back pain is in his lower spine and radiates down his right leg. It is not associated with any focal weakness or numbness. He also complains that he has been vomiting blood on and off for a few months now. He states he has a history of gastroesophageal reflux disease. It looks as if he has been prescribed Prilosec but states that he has not been taking it. He has problems with upper normal pain. He has not passed any blood in his stool that he knows of. He does admit that he avoids alcohol and caffeinated beverages. He's never had any history of ulcers in the past. So complains of severe sinus problems with stuffiness of his nose and frontal headaches. Over-the-counter medications that he is taken do not help.  Allergies  Allergen Reactions  . Amlodipine Rash   Past Medical History  Diagnosis Date  . Hypertension   . Allergy   . Depression   . Reflux    Current Outpatient Prescriptions on File Prior to Visit  Medication Sig Dispense Refill  . gabapentin (NEURONTIN) 300 MG capsule Take 1 capsule (300 mg total) by mouth daily.  90 capsule  0  . hydrochlorothiazide (HYDRODIURIL) 25 MG tablet Take 25 mg by mouth daily.      Marland Kitchen HYDROcodone-acetaminophen (VICODIN) 5-500 MG per tablet Take 1 tablet by mouth every 6 (six) hours as needed for pain.      Marland Kitchen lisinopril-hydrochlorothiazide (PRINZIDE,ZESTORETIC) 10-12.5 MG per tablet Take 1 tablet by mouth daily.  30 tablet  0  . omeprazole (PRILOSEC) 20 MG capsule Take 1 capsule (20 mg total) by mouth daily.  30 capsule  3  .  QUEtiapine (SEROQUEL) 100 MG tablet Take 100 mg by mouth at bedtime.      . sertraline (ZOLOFT) 100 MG tablet Take 100 mg by mouth daily.      . traMADol (ULTRAM) 50 MG tablet Take 50 mg by mouth every 6 (six) hours as needed for pain.      . traZODone (DESYREL) 100 MG tablet Take 100 mg by mouth at bedtime.       No current facility-administered medications on file prior to visit.   Family History  Problem Relation Age of Onset  . Cancer Mother   . Diabetes Mother   . Heart disease Mother   . Hyperlipidemia Mother   . Cancer Father   . Diabetes Father   . Heart disease Father   . Hyperlipidemia Father    History   Social History  . Marital Status: Single    Spouse Name: N/A    Number of Children: N/A  . Years of Education: N/A   Occupational History  . Not on file.   Social History Main Topics  . Smoking status: Light Tobacco Smoker -- 0.25 packs/day    Types: Cigarettes  . Smokeless tobacco: Not on file  . Alcohol Use: No  . Drug Use: No  . Sexually Active: Not on file   Other Topics Concern  . Not  on file   Social History Narrative  . No narrative on file    Review of Systems ______ Constitutional: Negative for fever, chills, diaphoresis, activity change, appetite change and fatigue. ____ HENT: Positive for nasal congestion and frontal headaches. Negative for ear pain, nosebleeds, facial swelling, rhinorrhea, neck pain, neck stiffness and ear discharge.  ____ Eyes: Negative for pain, discharge, redness, itching and visual disturbance. ____ Respiratory: Negative for cough, choking, chest tightness, shortness of breath, wheezing and stridor.  ____ Cardiovascular: Negative for chest pain, palpitations and leg swelling. ____ Gastrointestinal: Negative for abdominal distention. Positive for vomiting up blood and severe heartburn. Genitourinary: Negative for dysuria, urgency, frequency, hematuria, flank pain, decreased urine volume, difficulty urinating and  dyspareunia. ____ Musculoskeletal: Also to for back pain radiating down right leg, joint swelling, arthralgias and gait problem. ________ Neurological: Negative for dizziness, tremors, seizures, syncope, facial asymmetry, speech difficulty, weakness, light-headedness, numbness and headaches. ____ Hematological: Negative for adenopathy. Does not bruise/bleed easily. ____ Psychiatric/Behavioral: Negative for hallucinations, behavioral problems, confusion, dysphoric mood, decreased concentration and agitation. ______   Objective:   Filed Vitals:   07/30/12 1140  BP: 160/108  Pulse: 76  Temp: 98.5 F (36.9 C)    Physical Exam ______ Constitutional: Appears well-developed and well-nourished. No distress. ____ HENT: Normocephalic. External right and left ear normal. Oropharynx is clear and moist. ____ Eyes: Conjunctivae and EOM are normal. PERRLA, no scleral icterus. ____ Neck: Normal ROM. Neck supple. No JVD. No tracheal deviation. No thyromegaly. ____ CVS: RRR, S1/S2 +, no murmurs, no gallops, no carotid bruit.  Pulmonary: Effort and breath sounds normal, no stridor, rhonchi, wheezes, rales.  Abdominal: Soft. BS +,  no distension, tenderness, rebound or guarding. ________ Musculoskeletal: Normal range of motion. No edema and no tenderness. ____ Lymphadenopathy: No lymphadenopathy noted, cervical, inguinal. Neuro: Alert. Normal reflexes, muscle tone coordination. No cranial nerve deficit. Skin: Skin is warm and dry. No rash noted. Not diaphoretic. No erythema. No pallor. ____ Psychiatric: Normal mood and affect. Behavior, judgment, thought content normal. __  Lab Results  Component Value Date   WBC 5.8 07/05/2012   HGB 11.7* 07/05/2012   HCT 37.1* 07/05/2012   MCV 61.1* 07/05/2012   PLT 269 07/05/2012   Lab Results  Component Value Date   CREATININE 1.11 07/05/2012   BUN 12 07/05/2012   NA 135 07/05/2012   K 4.4 07/05/2012   CL 102 07/05/2012   CO2 23 07/05/2012    Lab Results  Component Value  Date   HGBA1C 5.6 07/05/2012   Lipid Panel     Component Value Date/Time   CHOL 149 07/05/2012 0912   TRIG 73 07/05/2012 0912   HDL 55 07/05/2012 0912   CHOLHDL 2.7 07/05/2012 0912   VLDL 15 07/05/2012 0912   LDLCALC 79 07/05/2012 0912       Assessment and plan:   Patient Active Problem List   Diagnosis Date Noted  . Lumbosacral disc herniation 07/30/2012  . HTN (hypertension) 06/28/2012  . Back pain 06/28/2012  . Depression 06/28/2012  . SUBSTANCE ABUSE, MULTIPLE 10/14/2009  . GERD 10/14/2009  . CHEST PAIN, ATYPICAL 10/14/2009  . HYPOKALEMIA, HX OF 10/14/2009     #1. Lower back pain secondary to lumbar disc herniation: Continue pain medications including Neurontin.  #2. Upper GI bleed/anemia possibly do to acute blood loss:  I have recommended that he start taking Prilosec 40 mg twice a day. He is strictly advised to avoid alcohol aspirin ibuprofen and Naprosyn. He will be referred  to GI for upper endoscopy. Of note patient is anemic- we will need to followup on this in 2-3 months.  #3.hypertension: BP quite uncontrolled I have increased his lisinopril/HCTZ from 10/12.5-20/25. He is advised to fill the prescription and started immediately- he is further advised to avoid salt.  #4. Sinus congestion: I will prescribe fluticasone for this.

## 2012-08-08 ENCOUNTER — Telehealth: Payer: Self-pay | Admitting: Family Medicine

## 2012-08-08 NOTE — Telephone Encounter (Signed)
Please inform patient that Dr. Butler Denmark did write a letter for him and you can print it and provide it to him as needed.  She had already sent or faxed out the letter when she was here.    Rodney Langton, MD, CDE, FAAFP [facility] [place], [place]

## 2012-08-08 NOTE — Telephone Encounter (Signed)
Pt needs letter for disability; please call back clarify details

## 2012-08-09 NOTE — Telephone Encounter (Signed)
Patient is aware of  Letter dr Butler Denmark did

## 2012-08-21 ENCOUNTER — Encounter (HOSPITAL_COMMUNITY): Payer: Self-pay | Admitting: *Deleted

## 2012-08-21 DIAGNOSIS — Z862 Personal history of diseases of the blood and blood-forming organs and certain disorders involving the immune mechanism: Secondary | ICD-10-CM

## 2012-08-21 HISTORY — DX: Personal history of diseases of the blood and blood-forming organs and certain disorders involving the immune mechanism: Z86.2

## 2012-08-23 ENCOUNTER — Encounter (HOSPITAL_COMMUNITY): Payer: Self-pay | Admitting: Pharmacy Technician

## 2012-08-28 ENCOUNTER — Other Ambulatory Visit: Payer: Self-pay | Admitting: Gastroenterology

## 2012-08-28 NOTE — Addendum Note (Signed)
Addended by: Willis Modena on: 08/28/2012 04:16 PM   Modules accepted: Orders

## 2012-08-29 ENCOUNTER — Ambulatory Visit: Payer: No Typology Code available for payment source

## 2012-08-29 ENCOUNTER — Encounter (HOSPITAL_COMMUNITY): Payer: Self-pay | Admitting: *Deleted

## 2012-08-29 ENCOUNTER — Ambulatory Visit (HOSPITAL_COMMUNITY)
Admission: RE | Admit: 2012-08-29 | Discharge: 2012-08-29 | Disposition: A | Discharge status: Home / Self Care | Payer: No Typology Code available for payment source | Source: Ambulatory Visit | Attending: Gastroenterology | Admitting: Gastroenterology

## 2012-08-29 ENCOUNTER — Encounter (HOSPITAL_COMMUNITY): Payer: Self-pay | Admitting: Anesthesiology

## 2012-08-29 ENCOUNTER — Ambulatory Visit (HOSPITAL_COMMUNITY): Payer: No Typology Code available for payment source | Admitting: Anesthesiology

## 2012-08-29 ENCOUNTER — Encounter (HOSPITAL_COMMUNITY)
Admission: RE | Disposition: A | Discharge status: Home / Self Care | Payer: Self-pay | Source: Ambulatory Visit | Attending: Gastroenterology

## 2012-08-29 DIAGNOSIS — K2289 Other specified disease of esophagus: Secondary | ICD-10-CM | POA: Insufficient documentation

## 2012-08-29 DIAGNOSIS — K449 Diaphragmatic hernia without obstruction or gangrene: Secondary | ICD-10-CM | POA: Insufficient documentation

## 2012-08-29 DIAGNOSIS — K209 Esophagitis, unspecified without bleeding: Secondary | ICD-10-CM | POA: Insufficient documentation

## 2012-08-29 DIAGNOSIS — I10 Essential (primary) hypertension: Secondary | ICD-10-CM | POA: Insufficient documentation

## 2012-08-29 DIAGNOSIS — D509 Iron deficiency anemia, unspecified: Secondary | ICD-10-CM | POA: Insufficient documentation

## 2012-08-29 DIAGNOSIS — K920 Hematemesis: Secondary | ICD-10-CM | POA: Insufficient documentation

## 2012-08-29 DIAGNOSIS — G4730 Sleep apnea, unspecified: Secondary | ICD-10-CM | POA: Insufficient documentation

## 2012-08-29 DIAGNOSIS — K921 Melena: Secondary | ICD-10-CM | POA: Insufficient documentation

## 2012-08-29 DIAGNOSIS — K228 Other specified diseases of esophagus: Secondary | ICD-10-CM | POA: Insufficient documentation

## 2012-08-29 HISTORY — PX: COLONOSCOPY WITH PROPOFOL: SHX5780

## 2012-08-29 HISTORY — DX: Gastro-esophageal reflux disease without esophagitis: K21.9

## 2012-08-29 HISTORY — DX: Anxiety disorder, unspecified: F41.9

## 2012-08-29 HISTORY — DX: Shortness of breath: R06.02

## 2012-08-29 HISTORY — DX: Personal history of diseases of the blood and blood-forming organs and certain disorders involving the immune mechanism: Z86.2

## 2012-08-29 HISTORY — DX: Unspecified osteoarthritis, unspecified site: M19.90

## 2012-08-29 HISTORY — DX: Headache: R51

## 2012-08-29 HISTORY — PX: ESOPHAGOGASTRODUODENOSCOPY (EGD) WITH PROPOFOL: SHX5813

## 2012-08-29 HISTORY — DX: Sleep apnea, unspecified: G47.30

## 2012-08-29 SURGERY — ESOPHAGOGASTRODUODENOSCOPY (EGD) WITH PROPOFOL
Anesthesia: Monitor Anesthesia Care

## 2012-08-29 MED ORDER — KETAMINE HCL 10 MG/ML IJ SOLN
INTRAMUSCULAR | Status: DC | PRN
  Administered 2012-08-29: 10 mg via INTRAVENOUS
  Administered 2012-08-29: 20 mg via INTRAVENOUS

## 2012-08-29 MED ORDER — BUTAMBEN-TETRACAINE-BENZOCAINE 2-2-14 % EX AERO
INHALATION_SPRAY | CUTANEOUS | Status: DC | PRN
  Administered 2012-08-29: 2 via TOPICAL

## 2012-08-29 MED ORDER — LACTATED RINGERS IV SOLN
INTRAVENOUS | Status: DC
  Administered 2012-08-29: 09:00:00 via INTRAVENOUS

## 2012-08-29 MED ORDER — MIDAZOLAM HCL 5 MG/5ML IJ SOLN
INTRAMUSCULAR | Status: DC | PRN
  Administered 2012-08-29: 2 mg via INTRAVENOUS

## 2012-08-29 MED ORDER — PROPOFOL INFUSION 10 MG/ML OPTIME
INTRAVENOUS | Status: DC | PRN
  Administered 2012-08-29: 180 ug/kg/min via INTRAVENOUS

## 2012-08-29 MED ORDER — SODIUM CHLORIDE 0.9 % IV SOLN: INTRAVENOUS | Status: DC

## 2012-08-29 MED ORDER — OMEPRAZOLE 40 MG PO CPDR: 40.0000 mg | DELAYED_RELEASE_CAPSULE | Freq: Two times a day (BID) | ORAL | Status: DC

## 2012-08-29 SURGICAL SUPPLY — 24 items

## 2012-08-29 NOTE — Op Note (Signed)
[facility] [address] [place], [address]   ENDOSCOPY PROCEDURE REPORT  PATIENT: Christopher, Mcintyre  MR#: [id number] BIRTHDATE: 01-Mar-1975 , 37  yrs. old GENDER: Male ENDOSCOPIST: Willis Modena, MD REFERRED BY:  Standley Dakins, M.D. PROCEDURE DATE:  08/29/2012 PROCEDURE:  EGD, diagnostic ASA CLASS:     Class II INDICATIONS:  hematemesis, anemia, blood in stool. MEDICATIONS: MAC sedation, administered by CRNA TOPICAL ANESTHETIC: Cetacaine Spray  DESCRIPTION OF PROCEDURE: After the risks benefits and alternatives of the procedure were thoroughly explained, informed consent was obtained.  The Pentax Gastroscope [id number] endoscope was introduced through the mouth and advanced to the second portion of the duodenum. Without limitations.  The instrument was slowly withdrawn as the mucosa was fully examined.     Findings:  Medium-sized hiatal hernia with couple Cameron's erosions.  Moderately severe (LA-C) erosive esophagitis of the mid-to-distal esophagus.  Esophagus otherwise normal; no Mallory-Weiss tear or varices were seen.  Retroflexed view into cardia showed hiatal hernia but no obvious gastric varices. Endoscopy otherwise normal to the second portion of the duodenum.          The scope was then withdrawn from the patient and the procedure completed.  ENDOSCOPIC IMPRESSION:     As above.  Esophagitis and Cameron's erosions could each lead to intermittent bloody emesis.  RECOMMENDATIONS:     1.  Watch for potential complications of procedure. 2   Conservative antireflux precautions. 3.  Omeprazole 40 mg bid until further notice. 4.  Will need repeat endoscopy in 2-3 months to assess for esophagitis healing. 5.  Proceed with colonoscopy today.  eSigned:  Willis Modena, MD 08/29/2012 10:19 AM   CC:

## 2012-08-29 NOTE — Transfer of Care (Signed)
Immediate Anesthesia Transfer of Care Note  Patient: Christopher Mcintyre  Procedure(s) Performed: Procedure(s): ESOPHAGOGASTRODUODENOSCOPY (EGD) WITH PROPOFOL (N/A) COLONOSCOPY WITH PROPOFOL (N/A)  Patient Location: PACU  Anesthesia Type:MAC  Level of Consciousness: awake, alert  and oriented  Airway & Oxygen Therapy: Patient Spontanous Breathing and Patient connected to nasal cannula oxygen  Post-op Assessment: Report given to PACU RN and Post -op Vital signs reviewed and stable  Post vital signs: Reviewed and stable  Complications: No apparent anesthesia complications

## 2012-08-29 NOTE — Anesthesia Preprocedure Evaluation (Signed)
Anesthesia Evaluation  Patient identified by MRN, date of birth, ID band Patient awake    Reviewed: Allergy & Precautions, H&P , NPO status , Patient's Chart, lab work & pertinent test results  Airway Mallampati: II TM Distance: >3 FB Neck ROM: full    Dental no notable dental hx. (+) Teeth Intact and Dental Advisory Given   Pulmonary shortness of breath and with exertion, sleep apnea , Current Smoker,  breath sounds clear to auscultation  Pulmonary exam normal       Cardiovascular Exercise Tolerance: Good hypertension, Pt. on medications Rhythm:regular Rate:Normal     Neuro/Psych  Headaches, negative neurological ROS  negative psych ROS   GI/Hepatic negative GI ROS, Neg liver ROS, GERD-  Medicated and Controlled,(+)     substance abuse  alcohol use,   Endo/Other  negative endocrine ROS  Renal/GU negative Renal ROS  negative genitourinary   Musculoskeletal   Abdominal   Peds  Hematology negative hematology ROS (+)   Anesthesia Other Findings   Reproductive/Obstetrics negative OB ROS                           Anesthesia Physical Anesthesia Plan  ASA: II  Anesthesia Plan: MAC   Post-op Pain Management:    Induction:   Airway Management Planned: Simple Face Mask  Additional Equipment:   Intra-op Plan:   Post-operative Plan:   Informed Consent: I have reviewed the patients History and Physical, chart, labs and discussed the procedure including the risks, benefits and alternatives for the proposed anesthesia with the patient or authorized representative who has indicated his/her understanding and acceptance.   Dental Advisory Given  Plan Discussed with: CRNA and Surgeon  Anesthesia Plan Comments:         Anesthesia Quick Evaluation

## 2012-08-29 NOTE — Op Note (Signed)
[facility] [address] [place], [address]   COLONOSCOPY PROCEDURE REPORT  PATIENT: Christopher Mcintyre, Christopher Mcintyre  MR#: [id number] BIRTHDATE: [birth year] , 37  yrs. old GENDER: Male ENDOSCOPIST: Willis Modena, MD REFERRED AO:[id number] Laural Benes, M.D. PROCEDURE DATE:  08/29/2012 PROCEDURE:   Incomplete colonoscopy, diagnostic ASA CLASS:   Class II INDICATIONS:blood in stool, anemia. MEDICATIONS: MAC sedation, administered by CRNA  DESCRIPTION OF PROCEDURE:   After the risks benefits and alternatives of the procedure were thoroughly explained, informed consent was obtained.  A digital rectal exam revealed no abnormalities of the rectum.   The Pentax Ped Colon [id number] endoscope was introduced through the anus and advanced to the sigmoid colon. No adverse events experienced.   The quality of the prep was poor.  The instrument was then slowly withdrawn as the colon was fully examined.     Findings:  Digital rectal exam normal.  Prep quality was poor, starting at the level of the rectum and worsening as the scope advanced proximally.  Procedure aborted at level of mid sigmoid colon due to poor prep.        Withdrawal time was not applicable (incomplete colonoscopy)     .  The scope was withdrawn and the procedure completed.  ENDOSCOPIC IMPRESSION:     Incomplete colonoscopy, aborted due to poor bowel preparation.  RECOMMENDATIONS:     1.  Watch for potential complications of procedure. 2.  Needs repeat colonoscopy, with propofol, with further bowel preparation. 3.  Follow-up with [facility] after complete colonoscopy has been done.  eSigned:  Willis Modena, MD 08/29/2012 10:25 AM   cc:

## 2012-08-29 NOTE — Anesthesia Postprocedure Evaluation (Signed)
  Anesthesia Post-op Note  Patient: Christopher Mcintyre  Procedure(s) Performed: Procedure(s) (LRB): ESOPHAGOGASTRODUODENOSCOPY (EGD) WITH PROPOFOL (N/A) COLONOSCOPY WITH PROPOFOL (N/A)  Patient Location: PACU  Anesthesia Type: MAC  Level of Consciousness: awake and alert   Airway and Oxygen Therapy: Patient Spontanous Breathing  Post-op Pain: mild  Post-op Assessment: Post-op Vital signs reviewed, Patient's Cardiovascular Status Stable, Respiratory Function Stable, Patent Airway and No signs of Nausea or vomiting  Last Vitals:  Filed Vitals:   08/29/12 1040  BP: 162/108  Temp:   Resp: 11    Post-op Vital Signs: stable   Complications: No apparent anesthesia complications

## 2012-08-29 NOTE — H&P (Signed)
Patient interval history reviewed.  Patient examined again.  There has been no change from documented H/P dated 72214 (scanned into chart from our office) except as documented above.  Assessment:  1.  Hematemesis. 2.  Blood in stool. 3.  Microcytic anemia.  Plan:  1.  Endoscopy. 2.  Risks (bleeding, infection, bowel perforation that could require surgery, sedation-related changes in cardiopulmonary systems), benefits (identification and possible treatment of source of symptoms, exclusion of certain causes of symptoms), and alternatives (watchful waiting, radiographic imaging studies, empiric medical treatment) of upper endoscopy (EGD) were explained to patient in detail and patient wishes to proceed. 3.  Colonoscopy 4.  Risks (bleeding, infection, bowel perforation that could require surgery, sedation-related changes in cardiopulmonary systems), benefits (identification and possible treatment of source of symptoms, exclusion of certain causes of symptoms), and alternatives (watchful waiting, radiographic imaging studies, empiric medical treatment) of colonoscopy were explained to patient in detail and patient wishes to proceed.

## 2012-08-30 ENCOUNTER — Encounter (HOSPITAL_COMMUNITY): Payer: Self-pay | Admitting: Gastroenterology

## 2012-09-04 ENCOUNTER — Ambulatory Visit: Payer: No Typology Code available for payment source

## 2012-09-12 ENCOUNTER — Ambulatory Visit: Payer: No Typology Code available for payment source

## 2012-09-24 ENCOUNTER — Ambulatory Visit: Payer: No Typology Code available for payment source

## 2012-09-27 ENCOUNTER — Ambulatory Visit: Payer: No Typology Code available for payment source | Attending: Family Medicine | Admitting: Internal Medicine

## 2012-09-27 VITALS — BP 159/103 | HR 79 | Temp 99.2°F | Resp 18 | Wt 181.0 lb

## 2012-09-27 DIAGNOSIS — I10 Essential (primary) hypertension: Secondary | ICD-10-CM | POA: Insufficient documentation

## 2012-09-27 DIAGNOSIS — M549 Dorsalgia, unspecified: Secondary | ICD-10-CM | POA: Insufficient documentation

## 2012-09-27 DIAGNOSIS — K219 Gastro-esophageal reflux disease without esophagitis: Secondary | ICD-10-CM | POA: Insufficient documentation

## 2012-09-27 DIAGNOSIS — F329 Major depressive disorder, single episode, unspecified: Secondary | ICD-10-CM | POA: Insufficient documentation

## 2012-09-27 DIAGNOSIS — K029 Dental caries, unspecified: Secondary | ICD-10-CM

## 2012-09-27 DIAGNOSIS — F3289 Other specified depressive episodes: Secondary | ICD-10-CM | POA: Insufficient documentation

## 2012-09-27 MED ORDER — TRAZODONE HCL 100 MG PO TABS: 100.0000 mg | ORAL_TABLET | Freq: Every evening | ORAL | Status: DC | PRN

## 2012-09-27 MED ORDER — GABAPENTIN 300 MG PO CAPS: 300.0000 mg | ORAL_CAPSULE | Freq: Three times a day (TID) | ORAL | Status: DC

## 2012-09-27 MED ORDER — SERTRALINE HCL 100 MG PO TABS: 100.0000 mg | ORAL_TABLET | Freq: Every day | ORAL | Status: DC

## 2012-09-27 MED ORDER — OMEPRAZOLE 40 MG PO CPDR: 40.0000 mg | DELAYED_RELEASE_CAPSULE | Freq: Two times a day (BID) | ORAL | Status: DC

## 2012-09-27 MED ORDER — LISINOPRIL-HYDROCHLOROTHIAZIDE 20-25 MG PO TABS: 1.0000 | ORAL_TABLET | Freq: Every morning | ORAL | Status: DC

## 2012-09-27 MED ORDER — FLUTICASONE PROPIONATE 50 MCG/ACT NA SUSP: 2.0000 | Freq: Every day | NASAL | Status: AC

## 2012-09-27 NOTE — Patient Instructions (Signed)

## 2012-09-27 NOTE — Progress Notes (Signed)
Patient ID: Christopher Mcintyre, male   DOB: 27-Jan-1976, 37 y.o.   MRN: [id number]   CC: Followup and request for refill on medicines  HPI: Patient is a 37 year old male with history of hypertension, depression, acid reflux, anxiety who presents to clinic with request for refills on medicines. He denies chest pain or shortness of breath, no specific abdominal or urinary concerns, no fevers or chills. He explains he has not been checking his blood pressure regularly and is not sure if is controlled very well. He explains he ran out of the blood pressure medicine several days ago and will need refill now but otherwise explains he is compliant with medical therapy.  Allergies  Allergen Reactions  . Amlodipine Rash   Past Medical History  Diagnosis Date  . Hypertension   . Allergy     seasonal allergies-tx. Flonase Spray  . Depression   . Reflux   . Anxiety   . Sleep apnea     "was told this", no study done  . GERD (gastroesophageal reflux disease)   . Headache(784.0)     migraines-last 3 weeks ago.  . Arthritis     DDD-lumbar, tx. neurontin,bilateral tingling ,? rt. foot drop,heaviness of left leg  . H/O sickle cell trait 08-21-12    son age 75 has sickle cell disease, 2 daughters-has trait.  Marland Kitchen Shortness of breath     on exertion   Current Outpatient Prescriptions on File Prior to Visit  Medication Sig Dispense Refill  . diphenhydrAMINE (BENADRYL) 25 mg capsule Take 25 mg by mouth daily as needed for itching.      . ferrous sulfate 325 (65 FE) MG tablet Take 325 mg by mouth daily as needed (for iron).      . polyvinyl alcohol (LIQUIFILM TEARS) 1.4 % ophthalmic solution Place 1 drop into both eyes daily as needed (for dry eyes).       No current facility-administered medications on file prior to visit.   Family History  Problem Relation Age of Onset  . Cancer Mother   . Diabetes Mother   . Heart disease Mother   . Hyperlipidemia Mother   . Cancer Father   . Diabetes Father   .  Heart disease Father   . Hyperlipidemia Father    History   Social History  . Marital Status: Single    Spouse Name: N/A    Number of Children: N/A  . Years of Education: N/A   Occupational History  . Not on file.   Social History Main Topics  . Smoking status: Light Tobacco Smoker -- 0.25 packs/day    Types: Cigarettes  . Smokeless tobacco: Never Used  . Alcohol Use: No  . Drug Use: No  . Sexual Activity: Yes   Other Topics Concern  . Not on file   Social History Narrative  . No narrative on file    Review of Systems  Constitutional: Negative for fever, chills, diaphoresis, activity change, appetite change and fatigue.  HENT: Negative for ear pain, nosebleeds, congestion, facial swelling, rhinorrhea, neck pain, neck stiffness and ear discharge.   Eyes: Negative for pain, discharge, redness, itching and visual disturbance.  Respiratory: Negative for cough, choking, chest tightness, shortness of breath, wheezing and stridor.   Cardiovascular: Negative for chest pain, palpitations and leg swelling.  Gastrointestinal: Negative for abdominal distention.  Genitourinary: Negative for dysuria, urgency, frequency, hematuria, flank pain, decreased urine volume, difficulty urinating and dyspareunia.  Musculoskeletal: Negative for back pain, joint swelling, arthralgias and  gait problem.  Neurological: Negative for dizziness, tremors, seizures, syncope, facial asymmetry, speech difficulty, weakness, light-headedness, numbness and headaches.  Hematological: Negative for adenopathy. Does not bruise/bleed easily.  Psychiatric/Behavioral: Negative for hallucinations, behavioral problems, confusion, dysphoric mood, decreased concentration and agitation.    Objective:   Filed Vitals:   09/27/12 1041  BP: 159/103  Pulse: 79  Temp: 99.2 F (37.3 C)  Resp: 18    Physical Exam  Constitutional: Appears well-developed and well-nourished. No distress.  CVS: RRR, S1/S2 +, no murmurs, no  gallops, no carotid bruit.  Pulmonary: Effort and breath sounds normal, no stridor, rhonchi, wheezes, rales.  Abdominal: Soft. BS +,  no distension, tenderness, rebound or guarding.  Musculoskeletal: Normal range of motion. No edema and no tenderness.  Neuro: Alert. Normal reflexes, muscle tone coordination. No cranial nerve deficit. Psychiatric: Normal mood and affect. Behavior, judgment, thought content normal.   Lab Results  Component Value Date   WBC 5.8 07/05/2012   HGB 11.7* 07/05/2012   HCT 37.1* 07/05/2012   MCV 61.1* 07/05/2012   PLT 269 07/05/2012   Lab Results  Component Value Date   CREATININE 1.11 07/05/2012   BUN 12 07/05/2012   NA 135 07/05/2012   K 4.4 07/05/2012   CL 102 07/05/2012   CO2 23 07/05/2012    Lab Results  Component Value Date   HGBA1C 5.6 07/05/2012   Lipid Panel     Component Value Date/Time   CHOL 149 07/05/2012 0912   TRIG 73 07/05/2012 0912   HDL 55 07/05/2012 0912   CHOLHDL 2.7 07/05/2012 0912   VLDL 15 07/05/2012 0912   LDLCALC 79 07/05/2012 0912       Assessment and plan:   Patient Active Problem List   Diagnosis Date Noted  . HTN (hypertension) - we have discussed target blood pressure range and I have advised patient to continue checking blood pressure regularly and to call his back if the numbers are persistently higher than 140/90. We'll provide refill on medicine today area will need repeat blood test in 2 months, electrolyte panel.  06/28/2012  . Back pain - appears to be stable, patient explains no recent flares  06/28/2012  . Depression - stable at this time, we'll provide refill on medicine  06/28/2012  . GERD - continue proton pump inhibitor, provide prescription  10/14/2009

## 2012-09-27 NOTE — Progress Notes (Signed)
Patient here for follow up of his back pain

## 2012-10-09 ENCOUNTER — Telehealth: Payer: Self-pay | Admitting: Internal Medicine

## 2012-10-09 NOTE — Telephone Encounter (Signed)
Left message for pt to call clinic

## 2012-10-09 NOTE — Telephone Encounter (Signed)
Pt called regarding a breakout on the body, pt thinks it could be shingles, pt would like a call back as soon as possible @ [phone] or @ [phone]

## 2012-10-23 ENCOUNTER — Ambulatory Visit: Payer: No Typology Code available for payment source

## 2012-10-26 ENCOUNTER — Ambulatory Visit: Payer: No Typology Code available for payment source | Attending: Internal Medicine | Admitting: Internal Medicine

## 2012-10-26 VITALS — BP 161/111 | HR 80 | Temp 98.3°F | Ht 71.5 in | Wt 185.2 lb

## 2012-10-26 DIAGNOSIS — B029 Zoster without complications: Secondary | ICD-10-CM | POA: Insufficient documentation

## 2012-10-26 DIAGNOSIS — I10 Essential (primary) hypertension: Secondary | ICD-10-CM | POA: Insufficient documentation

## 2012-10-26 MED ORDER — ACYCLOVIR 400 MG PO TABS: 400.0000 mg | ORAL_TABLET | Freq: Every day | ORAL | Status: DC

## 2012-10-26 NOTE — Patient Instructions (Signed)
Shingles Shingles (herpes zoster) is an infection that is caused by the same virus that causes chickenpox (varicella). The infection causes a painful skin rash and fluid-filled blisters, which eventually break open, crust over, and heal. It may occur in any area of the body, but it usually affects only one side of the body or face. The pain of shingles usually lasts about 1 month. However, some people with shingles may develop long-term (chronic) pain in the affected area of the body. Shingles often occurs many years after the person had chickenpox. It is more common:  In people older than 50 years.  In people with weakened immune systems, such as those with HIV, AIDS, or cancer.  In people taking medicines that weaken the immune system, such as transplant medicines.  In people under great stress. CAUSES  Shingles is caused by the varicella zoster virus (VZV), which also causes chickenpox. After a person is infected with the virus, it can remain in the person's body for years in an inactive state (dormant). To cause shingles, the virus reactivates and breaks out as an infection in a nerve root. The virus can be spread from person to person (contagious) through contact with open blisters of the shingles rash. It will only spread to people who have not had chickenpox. When these people are exposed to the virus, they may develop chickenpox. They will not develop shingles. Once the blisters scab over, the person is no longer contagious and cannot spread the virus to others. SYMPTOMS  Shingles shows up in stages. The initial symptoms may be pain, itching, and tingling in an area of the skin. This pain is usually described as burning, stabbing, or throbbing.In a few days or weeks, a painful red rash will appear in the area where the pain, itching, and tingling were felt. The rash is usually on one side of the body in a band or belt-like pattern. Then, the rash usually turns into fluid-filled blisters. They  will scab over and dry up in approximately 2 3 weeks. Flu-like symptoms may also occur with the initial symptoms, the rash, or the blisters. These may include:  Fever.  Chills.  Headache.  Upset stomach. DIAGNOSIS  Your caregiver will perform a skin exam to diagnose shingles. Skin scrapings or fluid samples may also be taken from the blisters. This sample will be examined under a microscope or sent to a lab for further testing. TREATMENT  There is no specific cure for shingles. Your caregiver will likely prescribe medicines to help you manage the pain, recover faster, and avoid long-term problems. This may include antiviral drugs, anti-inflammatory drugs, and pain medicines. HOME CARE INSTRUCTIONS   Take a cool bath or apply cool compresses to the area of the rash or blisters as directed. This may help with the pain and itching.   Only take over-the-counter or prescription medicines as directed by your caregiver.   Rest as directed by your caregiver.  Keep your rash and blisters clean with mild soap and cool water or as directed by your caregiver.  Do not pick your blisters or scratch your rash. Apply an anti-itch cream or numbing creams to the affected area as directed by your caregiver.  Keep your shingles rash covered with a loose bandage (dressing).  Avoid skin contact with:  Babies.   Pregnant women.   Children with eczema.   Elderly people with transplants.   People with chronic illnesses, such as leukemia or AIDS.   Wear loose-fitting clothing to help ease   the pain of material rubbing against the rash.  Keep all follow-up appointments with your caregiver.If the area involved is on your face, you may receive a referral for follow-up to a specialist, such as an eye doctor (ophthalmologist) or an ear, nose, and throat (ENT) doctor. Keeping all follow-up appointments will help you avoid eye complications, chronic pain, or disability.  SEEK IMMEDIATE MEDICAL  CARE IF:   You have facial pain, pain around the eye area, or loss of feeling on one side of your face.  You have ear pain or ringing in your ear.  You have loss of taste.  Your pain is not relieved with prescribed medicines.   Your redness or swelling spreads.   You have more pain and swelling.  Your condition is worsening or has changed.   You have a feveror persistent symptoms for more than 2 3 days.  You have a fever and your symptoms suddenly get worse. MAKE SURE YOU:  Understand these instructions.  Will watch your condition.  Will get help right away if you are not doing well or get worse. Document Released: 01/17/2005 Document Revised: 10/12/2011 Document Reviewed: 09/01/2011 Exit[employer] Patient Information 2014 [employer].  

## 2012-10-26 NOTE — Progress Notes (Signed)
Patient ID: Christopher Mcintyre, male   DOB: 1975/11/02, 37 y.o.   MRN: [id number]   CC: followup   HPI: patient is 37 year old male who comes to clinic for followup. He explains he had recent outbreak of shingles and is doing better now. This is his second outbreak of shingles. He explains his rash is now resolving he only has small marks left post infection. He denies fevers and chills, no sick contacts or exposures, no systemic symptoms such as fever, weight loss, night sweats.   Allergies  Allergen Reactions  . Amlodipine Rash   Past Medical History  Diagnosis Date  . Hypertension   . Allergy     seasonal allergies-tx. Flonase Spray  . Depression   . Reflux   . Anxiety   . Sleep apnea     "was told this", no study done  . GERD (gastroesophageal reflux disease)   . Headache(784.0)     migraines-last 3 weeks ago.  . Arthritis     DDD-lumbar, tx. neurontin,bilateral tingling ,? rt. foot drop,heaviness of left leg  . H/O sickle cell trait 08-21-12    son age 50 has sickle cell disease, 2 daughters-has trait.  Marland Kitchen Shortness of breath     on exertion   Current Outpatient Prescriptions on File Prior to Visit  Medication Sig Dispense Refill  . ferrous sulfate 325 (65 FE) MG tablet Take 325 mg by mouth daily as needed (for iron).      . fluticasone (FLONASE) 50 MCG/ACT nasal spray Place 2 sprays into the nose daily.  16 g  11  . gabapentin (NEURONTIN) 300 MG capsule Take 1 capsule (300 mg total) by mouth 3 (three) times daily.  90 capsule  5  . lisinopril-hydrochlorothiazide (PRINZIDE,ZESTORETIC) 20-25 MG per tablet Take 1 tablet by mouth every morning.  30 tablet  5  . omeprazole (PRILOSEC) 40 MG capsule Take 1 capsule (40 mg total) by mouth 2 (two) times daily before a meal.  60 capsule  5  . polyvinyl alcohol (LIQUIFILM TEARS) 1.4 % ophthalmic solution Place 1 drop into both eyes daily as needed (for dry eyes).      . sertraline (ZOLOFT) 100 MG tablet Take 1 tablet (100 mg total) by mouth  daily at 12 noon.  30 tablet  5  . traZODone (DESYREL) 100 MG tablet Take 1 tablet (100 mg total) by mouth at bedtime as needed for sleep.  30 tablet  5   No current facility-administered medications on file prior to visit.   Family History  Problem Relation Age of Onset  . Cancer Mother   . Diabetes Mother   . Heart disease Mother   . Hyperlipidemia Mother   . Cancer Father   . Diabetes Father   . Heart disease Father   . Hyperlipidemia Father    History   Social History  . Marital Status: Single    Spouse Name: N/A    Number of Children: N/A  . Years of Education: N/A   Occupational History  . Not on file.   Social History Main Topics  . Smoking status: Light Tobacco Smoker -- 0.25 packs/day    Types: Cigarettes  . Smokeless tobacco: Never Used  . Alcohol Use: No  . Drug Use: No  . Sexual Activity: Yes   Other Topics Concern  . Not on file   Social History Narrative  . No narrative on file    Review of Systems  Constitutional: Negative for fever, chills, diaphoresis, activity  change, appetite change and fatigue.  HENT: Negative for ear pain, nosebleeds, congestion, facial swelling, rhinorrhea, neck pain, neck stiffness and ear discharge.   Eyes: Negative for pain, discharge, redness, itching and visual disturbance.  Respiratory: Negative for cough, choking, chest tightness, shortness of breath, wheezing and stridor.   Cardiovascular: Negative for chest pain, palpitations and leg swelling.  Gastrointestinal: Negative for abdominal distention.  Genitourinary: Negative for dysuria, urgency, frequency, hematuria, flank pain, decreased urine volume, difficulty urinating and dyspareunia.  Musculoskeletal: Negative for back pain, joint swelling, arthralgias and gait problem.  Neurological: Negative for dizziness, tremors, seizures, syncope, facial asymmetry, speech difficulty, weakness, light-headedness, numbness and headaches.  Hematological: Negative for adenopathy.  Does not bruise/bleed easily.  Psychiatric/Behavioral: Negative for hallucinations, behavioral problems, confusion, dysphoric mood, decreased concentration and agitation.    Objective:   Filed Vitals:   10/26/12 1210  BP: 161/111  Pulse: 80  Temp: 98.3 F (36.8 C)    Physical Exam  Constitutional: Appears well-developed and well-nourished. No distress.  CVS: RRR, S1/S2 +, no murmurs, no gallops, no carotid bruit.  Pulmonary: Effort and breath sounds normal, no stridor, rhonchi, wheezes, rales.  Abdominal: Soft. BS +,  no distension, tenderness, rebound or guarding.  Musculoskeletal: Normal range of motion. No edema and no tenderness.  Lymphadenopathy: No lymphadenopathy noted, cervical, inguinal.   Lab Results  Component Value Date   WBC 5.8 07/05/2012   HGB 11.7* 07/05/2012   HCT 37.1* 07/05/2012   MCV 61.1* 07/05/2012   PLT 269 07/05/2012   Lab Results  Component Value Date   CREATININE 1.11 07/05/2012   BUN 12 07/05/2012   NA 135 07/05/2012   K 4.4 07/05/2012   CL 102 07/05/2012   CO2 23 07/05/2012    Lab Results  Component Value Date   HGBA1C 5.6 07/05/2012   Lipid Panel     Component Value Date/Time   CHOL 149 07/05/2012 0912   TRIG 73 07/05/2012 0912   HDL 55 07/05/2012 0912   CHOLHDL 2.7 07/05/2012 0912   VLDL 15 07/05/2012 0912   LDLCALC 79 07/05/2012 0912       Assessment and plan:   Patient Active Problem List   Diagnosis Date Noted  .  shingles  - skin appears clear, no signs of active infection, a few areas of excoriation from itching. Continue to monitor closely  07/30/2012  . HTN (hypertension) - blood pressure elevated and we have discussed target blood pressure range. Patient advised to check his blood pressure regularly to call us back if the numbers are persistently higher than 140/90 so we can readjust her medical regimen  06/28/2012  . Back pain 06/28/2012  . Depression 06/28/2012  . SUBSTANCE ABUSE, MULTIPLE 10/14/2009  . CHEST PAIN, ATYPICAL 10/14/2009  .  HYPOKALEMIA, HX OF 10/14/2009

## 2012-11-26 ENCOUNTER — Telehealth: Payer: Self-pay | Admitting: Internal Medicine

## 2012-11-26 NOTE — Telephone Encounter (Signed)
Pt's disability lawyer's advised pt he should see a back specialist for claim.  Pt was seen on 9/26 and says has discussed back issue with Dr.  Rock Nephew wondering if he can be referred to back Dr?  Please f/u with pt.

## 2012-12-28 ENCOUNTER — Ambulatory Visit: Payer: Self-pay

## 2013-01-14 ENCOUNTER — Ambulatory Visit: Payer: Self-pay

## 2013-01-28 ENCOUNTER — Encounter: Payer: Self-pay | Admitting: Internal Medicine

## 2013-01-28 ENCOUNTER — Ambulatory Visit: Payer: Self-pay | Attending: Internal Medicine | Admitting: Internal Medicine

## 2013-01-28 VITALS — BP 170/116 | HR 102 | Temp 98.3°F | Resp 16 | Ht 71.5 in | Wt 190.0 lb

## 2013-01-28 DIAGNOSIS — I517 Cardiomegaly: Secondary | ICD-10-CM

## 2013-01-28 DIAGNOSIS — R079 Chest pain, unspecified: Secondary | ICD-10-CM | POA: Insufficient documentation

## 2013-01-28 DIAGNOSIS — K219 Gastro-esophageal reflux disease without esophagitis: Secondary | ICD-10-CM | POA: Insufficient documentation

## 2013-01-28 DIAGNOSIS — I10 Essential (primary) hypertension: Secondary | ICD-10-CM

## 2013-01-28 LAB — COMPLETE METABOLIC PANEL WITH GFR
Albumin: 4.5 g/dL (ref 3.5–5.2)
Alkaline Phosphatase: 64 U/L (ref 39–117)
BUN: 11 mg/dL (ref 6–23)
CO2: 22 mEq/L (ref 19–32)
Calcium: 9.2 mg/dL (ref 8.4–10.5)
Chloride: 101 mEq/L (ref 96–112)
Creat: 1.11 mg/dL (ref 0.50–1.35)
GFR, Est African American: >89 mL/min
GFR, Est Non African American: 84 mL/min
Glucose, Bld: 87 mg/dL (ref 70–99)
Potassium: 4.5 mEq/L (ref 3.5–5.3)
Total Protein: 7.4 g/dL (ref 6.0–8.3)

## 2013-01-28 LAB — CBC WITH DIFFERENTIAL/PLATELET
Basophils Absolute: 0.1 10*3/uL (ref 0.0–0.1)
Eosinophils Relative: 1 % (ref 0–5)
Lymphocytes Relative: 26 % (ref 12–46)
Lymphs Abs: 1.9 10*3/uL (ref 0.7–4.0)
Neutrophils Relative %: 65 % (ref 43–77)
Platelets: 270 10*3/uL (ref 150–400)
RBC: 5.88 MIL/uL — ABNORMAL HIGH (ref 4.22–5.81)
RDW: 20.2 % — ABNORMAL HIGH (ref 11.5–15.5)
WBC: 7.5 10*3/uL (ref 4.0–10.5)

## 2013-01-28 LAB — TSH: TSH: 0.9 u[IU]/mL (ref 0.350–4.500)

## 2013-01-28 LAB — LIPID PANEL
Cholesterol: 182 mg/dL (ref 0–200)
LDL Cholesterol: 98 mg/dL (ref 0–99)
Triglycerides: 124 mg/dL (ref <150)
VLDL: 25 mg/dL (ref 0–40)

## 2013-01-28 MED ORDER — PANTOPRAZOLE SODIUM 40 MG PO TBEC: 40.0000 mg | DELAYED_RELEASE_TABLET | Freq: Every day | ORAL | Status: DC

## 2013-01-28 MED ORDER — LISINOPRIL-HYDROCHLOROTHIAZIDE 20-25 MG PO TABS: 1.0000 | ORAL_TABLET | Freq: Every morning | ORAL | Status: DC

## 2013-01-28 NOTE — Addendum Note (Signed)
Addended by: Susie Cassette MD, Germain Osgood on: 01/28/2013 11:21 AM   Modules accepted: Orders

## 2013-01-28 NOTE — Addendum Note (Signed)
Addended by: Susie Cassette MD, Germain Osgood on: 01/28/2013 11:19 AM   Modules accepted: Orders

## 2013-01-28 NOTE — Addendum Note (Signed)
Addended by: Susie Cassette MD, Germain Osgood on: 01/28/2013 12:13 PM   Modules accepted: Orders

## 2013-01-28 NOTE — Progress Notes (Signed)
Patient ID: Christopher Mcintyre, male   DOB: 25-Jan-1976, 37 y.o.   MRN: [id number]   CC:  HPI:  37 year old male, here for followup of his hypertension. The patient has not taken his medications for several weeks Complains of chest pain this morning but denies any shortness of breath He is also complaining of ESOPHAGEAL reflux and heartburn.  Allergies  Allergen Reactions  . Amlodipine Rash   Past Medical History  Diagnosis Date  . Hypertension   . Allergy     seasonal allergies-tx. Flonase Spray  . Depression   . Reflux   . Anxiety   . Sleep apnea     "was told this", no study done  . GERD (gastroesophageal reflux disease)   . Headache(784.0)     migraines-last 3 weeks ago.  . Arthritis     DDD-lumbar, tx. neurontin,bilateral tingling ,? rt. foot drop,heaviness of left leg  . H/O sickle cell trait 08-21-12    son age 13 has sickle cell disease, 2 daughters-has trait.  Marland Kitchen Shortness of breath     on exertion   Current Outpatient Prescriptions on File Prior to Visit  Medication Sig Dispense Refill  . fluticasone (FLONASE) 50 MCG/ACT nasal spray Place 2 sprays into the nose daily.  16 g  11  . gabapentin (NEURONTIN) 300 MG capsule Take 1 capsule (300 mg total) by mouth 3 (three) times daily.  90 capsule  5  . polyvinyl alcohol (LIQUIFILM TEARS) 1.4 % ophthalmic solution Place 1 drop into both eyes daily as needed (for dry eyes).      . sertraline (ZOLOFT) 100 MG tablet Take 1 tablet (100 mg total) by mouth daily at 12 noon.  30 tablet  5  . traZODone (DESYREL) 100 MG tablet Take 1 tablet (100 mg total) by mouth at bedtime as needed for sleep.  30 tablet  5  . acyclovir (ZOVIRAX) 400 MG tablet Take 1 tablet (400 mg total) by mouth 5 (five) times daily.  35 tablet  0  . ferrous sulfate 325 (65 FE) MG tablet Take 325 mg by mouth daily as needed (for iron).       No current facility-administered medications on file prior to visit.   Family History  Problem Relation Age of Onset  .  Cancer Mother   . Diabetes Mother   . Heart disease Mother   . Hyperlipidemia Mother   . Cancer Father   . Diabetes Father   . Heart disease Father   . Hyperlipidemia Father    History   Social History  . Marital Status: Single    Spouse Name: N/A    Number of Children: N/A  . Years of Education: N/A   Occupational History  . Not on file.   Social History Main Topics  . Smoking status: Light Tobacco Smoker -- 0.25 packs/day    Types: Cigarettes  . Smokeless tobacco: Never Used  . Alcohol Use: No  . Drug Use: No  . Sexual Activity: Yes   Other Topics Concern  . Not on file   Social History Narrative  . No narrative on file    Review of Systems  Constitutional: Negative for fever, chills, diaphoresis, activity change, appetite change and fatigue.  HENT: Negative for ear pain, nosebleeds, congestion, facial swelling, rhinorrhea, neck pain, neck stiffness and ear discharge.   Eyes: Negative for pain, discharge, redness, itching and visual disturbance.  Respiratory: Negative for cough, choking, chest tightness, shortness of breath, wheezing and stridor.  Cardiovascular: Negative for chest pain, palpitations and leg swelling.  Gastrointestinal: Negative for abdominal distention.  Genitourinary: Negative for dysuria, urgency, frequency, hematuria, flank pain, decreased urine volume, difficulty urinating and dyspareunia.  Musculoskeletal: Negative for back pain, joint swelling, arthralgias and gait problem.  Neurological: Negative for dizziness, tremors, seizures, syncope, facial asymmetry, speech difficulty, weakness, light-headedness, numbness and headaches.  Hematological: Negative for adenopathy. Does not bruise/bleed easily.  Psychiatric/Behavioral: Negative for hallucinations, behavioral problems, confusion, dysphoric mood, decreased concentration and agitation.    Objective:   Filed Vitals:   01/28/13 1049  BP: 170/116  Pulse: 102  Temp: 98.3 F (36.8 C)   Resp: 16    Physical Exam  Constitutional: Appears well-developed and well-nourished. No distress.  HENT: Normocephalic. External right and left ear normal. Oropharynx is clear and moist.  Eyes: Conjunctivae and EOM are normal. PERRLA, no scleral icterus.  Neck: Normal ROM. Neck supple. No JVD. No tracheal deviation. No thyromegaly.  CVS: RRR, S1/S2 +, no murmurs, no gallops, no carotid bruit.  Pulmonary: Effort and breath sounds normal, no stridor, rhonchi, wheezes, rales.  Abdominal: Soft. BS +,  no distension, tenderness, rebound or guarding.  Musculoskeletal: Normal range of motion. No edema and no tenderness.  Lymphadenopathy: No lymphadenopathy noted, cervical, inguinal. Neuro: Alert. Normal reflexes, muscle tone coordination. No cranial nerve deficit. Skin: Skin is warm and dry. No rash noted. Not diaphoretic. No erythema. No pallor.  Psychiatric: Normal mood and affect. Behavior, judgment, thought content normal.   Lab Results  Component Value Date   WBC 5.8 07/05/2012   HGB 11.7* 07/05/2012   HCT 37.1* 07/05/2012   MCV 61.1* 07/05/2012   PLT 269 07/05/2012   Lab Results  Component Value Date   CREATININE 1.11 07/05/2012   BUN 12 07/05/2012   NA 135 07/05/2012   K 4.4 07/05/2012   CL 102 07/05/2012   CO2 23 07/05/2012    Lab Results  Component Value Date   HGBA1C 5.6 07/05/2012   Lipid Panel     Component Value Date/Time   CHOL 149 07/05/2012 0912   TRIG 73 07/05/2012 0912   HDL 55 07/05/2012 0912   CHOLHDL 2.7 07/05/2012 0912   VLDL 15 07/05/2012 0912   LDLCALC 79 07/05/2012 0912       Assessment and plan:   Patient Active Problem List   Diagnosis Date Noted  . Lumbosacral disc herniation 07/30/2012  . HTN (hypertension) 06/28/2012  . Back pain 06/28/2012  . Depression 06/28/2012  . SUBSTANCE ABUSE, MULTIPLE 10/14/2009  . GERD 10/14/2009  . CHEST PAIN, ATYPICAL 10/14/2009  . HYPOKALEMIA, HX OF 10/14/2009       Hypertension Refill lisinopril HCTZ Repeat CMP today   Chest  pain Obtain EKG to rule out cardiac cause   Gastroesophageal reflux Discontinue Prilosec the patient has been provided with a prescription for Protonix  Follow up in 2 months for hypertension   The patient was given clear instructions to go to ER or return to medical center if symptoms don't improve, worsen or new problems develop. The patient verbalized understanding. The patient was told to call to get any lab results if not heard anything in the next week.

## 2013-01-28 NOTE — Progress Notes (Signed)
Pt is here following up on his HTN Pt is needing his medications refilled. 

## 2013-01-28 NOTE — Addendum Note (Signed)
Addended by: Susie Cassette MD, Germain Osgood on: 01/28/2013 11:40 AM   Modules accepted: Orders

## 2013-01-29 ENCOUNTER — Telehealth: Payer: Self-pay | Admitting: *Deleted

## 2013-01-29 ENCOUNTER — Telehealth: Payer: Self-pay | Admitting: Emergency Medicine

## 2013-01-29 LAB — VITAMIN D 25 HYDROXY (VIT D DEFICIENCY, FRACTURES): Vit D, 25-Hydroxy: 15 ng/mL — ABNORMAL LOW (ref 30–89)

## 2013-01-29 LAB — TROPONIN I: Troponin I: 0.01 ng/mL (ref <0.06)

## 2013-01-29 MED ORDER — VITAMIN D (ERGOCALCIFEROL) 1.25 MG (50000 UNIT) PO CAPS: 50000.0000 [IU] | ORAL_CAPSULE | ORAL | Status: DC

## 2013-01-29 NOTE — Telephone Encounter (Signed)
Spoke with pt and informed him to pick script up @ [facility] pharmacy. Verbalized understanding

## 2013-01-29 NOTE — Telephone Encounter (Signed)
Message copied by Santanna Whitford, [place] R on Tue Jan 29, 2013 11:56 AM ------      Message from: Susie Cassette MD, [facility]      Created: Tue Jan 29, 2013 10:43 AM       His notify patient of the patient's vitamin D level is low,prescribe regimen the 50,000 units weekly, 20 tablets with 2 refills ------

## 2013-01-29 NOTE — Telephone Encounter (Signed)
Left pt a voicemail to give us a call back. 

## 2013-02-11 ENCOUNTER — Ambulatory Visit (HOSPITAL_COMMUNITY)
Admission: RE | Admit: 2013-02-11 | Discharge: 2013-02-11 | Disposition: A | Discharge status: Home / Self Care | Payer: Self-pay | Source: Ambulatory Visit | Attending: Internal Medicine | Admitting: Internal Medicine

## 2013-02-11 DIAGNOSIS — I079 Rheumatic tricuspid valve disease, unspecified: Secondary | ICD-10-CM | POA: Insufficient documentation

## 2013-02-11 DIAGNOSIS — I10 Essential (primary) hypertension: Secondary | ICD-10-CM | POA: Insufficient documentation

## 2013-02-11 DIAGNOSIS — R0609 Other forms of dyspnea: Secondary | ICD-10-CM | POA: Insufficient documentation

## 2013-02-11 DIAGNOSIS — I369 Nonrheumatic tricuspid valve disorder, unspecified: Secondary | ICD-10-CM

## 2013-02-11 DIAGNOSIS — I517 Cardiomegaly: Secondary | ICD-10-CM

## 2013-02-11 DIAGNOSIS — R0989 Other specified symptoms and signs involving the circulatory and respiratory systems: Secondary | ICD-10-CM | POA: Insufficient documentation

## 2013-02-11 DIAGNOSIS — R079 Chest pain, unspecified: Secondary | ICD-10-CM | POA: Insufficient documentation

## 2013-02-11 NOTE — Progress Notes (Signed)
Echo Lab  2D Echocardiogram completed.  Christopher Mcintyre, RDCS 02/11/2013 10:33 AM

## 2013-02-18 ENCOUNTER — Telehealth: Payer: Self-pay | Admitting: *Deleted

## 2013-02-18 NOTE — Telephone Encounter (Signed)
Unable to leave a voicemail for pt.

## 2013-02-18 NOTE — Telephone Encounter (Signed)
Message copied by Ashaz Robling, [place] R on Mon Feb 18, 2013  9:47 AM ------      Message from: Susie CassetteABROL MD, [facility]      Created: Fri Feb 15, 2013  3:38 PM       Notify patient had 2-D echo is normal ------

## 2013-03-29 ENCOUNTER — Ambulatory Visit: Payer: Self-pay | Admitting: Internal Medicine

## 2013-04-03 ENCOUNTER — Ambulatory Visit: Payer: Self-pay

## 2013-04-04 ENCOUNTER — Encounter: Payer: Self-pay | Admitting: Pharmacist

## 2013-04-04 ENCOUNTER — Other Ambulatory Visit: Payer: Self-pay | Admitting: Emergency Medicine

## 2013-04-04 ENCOUNTER — Telehealth: Payer: Self-pay | Admitting: Emergency Medicine

## 2013-04-04 ENCOUNTER — Ambulatory Visit: Payer: Self-pay | Attending: Internal Medicine | Admitting: Pharmacist

## 2013-04-04 VITALS — BP 176/95 | HR 71 | Temp 99.1°F | Ht 71.0 in | Wt 186.2 lb

## 2013-04-04 DIAGNOSIS — R030 Elevated blood-pressure reading, without diagnosis of hypertension: Secondary | ICD-10-CM

## 2013-04-04 DIAGNOSIS — Z299 Encounter for prophylactic measures, unspecified: Secondary | ICD-10-CM

## 2013-04-04 DIAGNOSIS — G4730 Sleep apnea, unspecified: Secondary | ICD-10-CM

## 2013-04-04 DIAGNOSIS — IMO0001 Reserved for inherently not codable concepts without codable children: Secondary | ICD-10-CM

## 2013-04-04 MED ORDER — GABAPENTIN 300 MG PO CAPS: 300.0000 mg | ORAL_CAPSULE | Freq: Three times a day (TID) | ORAL | Status: DC

## 2013-04-04 MED ORDER — PANTOPRAZOLE SODIUM 40 MG PO TBEC: 40.0000 mg | DELAYED_RELEASE_TABLET | Freq: Every day | ORAL | Status: DC

## 2013-04-04 MED ORDER — HYDROCHLOROTHIAZIDE 25 MG PO TABS: 25.0000 mg | ORAL_TABLET | Freq: Every day | ORAL | Status: DC

## 2013-04-04 MED ORDER — CLONIDINE HCL 0.1 MG PO TABS
0.1000 mg | ORAL_TABLET | Freq: Once | ORAL | Status: AC
  Administered 2013-04-04: 0.1 mg via ORAL

## 2013-04-04 MED ORDER — ACYCLOVIR 400 MG PO TABS: 400.0000 mg | ORAL_TABLET | Freq: Every day | ORAL | Status: DC

## 2013-04-04 MED ORDER — LISINOPRIL 40 MG PO TABS: 40.0000 mg | ORAL_TABLET | Freq: Every day | ORAL | Status: AC

## 2013-04-04 MED ORDER — SERTRALINE HCL 100 MG PO TABS: 100.0000 mg | ORAL_TABLET | Freq: Every day | ORAL | Status: AC

## 2013-04-04 NOTE — Telephone Encounter (Signed)
Pt called in requesting medication refill on medication med's refilled except for Trazodone. Will speak with provider regarding refill Sleep study placed for sleep apnea

## 2013-04-04 NOTE — Patient Instructions (Signed)
DASH Diet  The DASH diet stands for "Dietary Approaches to Stop Hypertension." It is a healthy eating plan that has been shown to reduce high blood pressure (hypertension) in as little as 14 days, while also possibly providing other significant health benefits. These other health benefits include reducing the risk of breast cancer after menopause and reducing the risk of type 2 diabetes, heart disease, colon cancer, and stroke. Health benefits also include weight loss and slowing kidney failure in patients with chronic kidney disease.   DIET GUIDELINES  · Limit salt (sodium). Your diet should contain less than 1500 mg of sodium daily.  · Limit refined or processed carbohydrates. Your diet should include mostly whole grains. Desserts and added sugars should be used sparingly.  · Include small amounts of heart-healthy fats. These types of fats include nuts, oils, and tub margarine. Limit saturated and trans fats. These fats have been shown to be harmful in the body.  CHOOSING FOODS   The following food groups are based on a 2000 calorie diet. See your Registered Dietitian for individual calorie needs.  Grains and Grain Products (6 to 8 servings daily)  · Eat More Often: Whole-wheat bread, brown rice, whole-grain or wheat pasta, quinoa, popcorn without added fat or salt (air popped).  · Eat Less Often: White bread, white pasta, white rice, cornbread.  Vegetables (4 to 5 servings daily)  · Eat More Often: Fresh, frozen, and canned vegetables. Vegetables may be raw, steamed, roasted, or grilled with a minimal amount of fat.  · Eat Less Often/Avoid: Creamed or fried vegetables. Vegetables in a cheese sauce.  Fruit (4 to 5 servings daily)  · Eat More Often: All fresh, canned (in natural juice), or frozen fruits. Dried fruits without added sugar. One hundred percent fruit juice (½ cup [237 mL] daily).  · Eat Less Often: Dried fruits with added sugar. Canned fruit in light or heavy syrup.  Lean Meats, Fish, and Poultry (2  servings or less daily. One serving is 3 to 4 oz [85-114 g]).  · Eat More Often: Ninety percent or leaner ground beef, tenderloin, sirloin. Round cuts of beef, chicken breast, turkey breast. All fish. Grill, bake, or broil your meat. Nothing should be fried.  · Eat Less Often/Avoid: Fatty cuts of meat, turkey, or chicken leg, thigh, or wing. Fried cuts of meat or fish.  Dairy (2 to 3 servings)  · Eat More Often: Low-fat or fat-free milk, low-fat plain or light yogurt, reduced-fat or part-skim cheese.  · Eat Less Often/Avoid: Milk (whole, 2%). Whole milk yogurt. Full-fat cheeses.  Nuts, Seeds, and Legumes (4 to 5 servings per week)  · Eat More Often: All without added salt.  · Eat Less Often/Avoid: Salted nuts and seeds, canned beans with added salt.  Fats and Sweets (limited)  · Eat More Often: Vegetable oils, tub margarines without trans fats, sugar-free gelatin. Mayonnaise and salad dressings.  · Eat Less Often/Avoid: Coconut oils, palm oils, butter, stick margarine, cream, half and half, cookies, candy, pie.  FOR MORE INFORMATION  The Dash Diet Eating Plan: www.dashdiet.org  Document Released: 01/06/2011 Document Revised: 04/11/2011 Document Reviewed: 01/06/2011  Exit[employer]® Patient Information ©2014 [employer].

## 2013-04-04 NOTE — Progress Notes (Signed)
S:    Patient arrives to the clinic for ambulatory blood pressure evaluation.  Diagnosed with Hypertension in the year of 2012.    Medication compliance is reported to be taking everyday.  Current BP Medications include:  Lisinopril/hctz 20/25  Antihypertensives tried in the past include: amlopidine (allergy)  Patient returned to the clinic and reported   O:  Last 3 Office BP readings: 170/116 mmHg 161/111 mmHg 159/103 mmHg  Today's Office BP reading: 171/106 mmHg After clonidine 176/95 mmHg  BMET    Component Value Date/Time   NA 135 01/28/2013 1106   K 4.5 01/28/2013 1106   CL 101 01/28/2013 1106   CO2 22 01/28/2013 1106   GLUCOSE 87 01/28/2013 1106   BUN 11 01/28/2013 1106   CREATININE 1.11 01/28/2013 1106   CREATININE 1.18 10/14/2009 1628   CALCIUM 9.2 01/28/2013 1106   GFRNONAA >60 10/10/2009 0543   GFRAA  Value: >60        The eGFR has been calculated using the MDRD equation. This calculation has not been validated in all clinical situations. eGFR's persistently <60 mL/min signify possible Chronic Kidney Disease. 10/10/2009 0543    A/P: Gave pt Clonidine 0.1 mg in clinic today.    Pt's blood pressure is not goal this visit.  He has consistently had elevated blood pressure for the past 3 visits.  Will increase pt to Lisinopril from 20/25 to 40/25 mg and have in come back in 2 weeks to be rechecked with PharmD.

## 2013-04-04 NOTE — Telephone Encounter (Signed)
Left message for pt to call back when available.

## 2013-04-04 NOTE — Telephone Encounter (Signed)
Message copied by Darlis LoanSMITH, JILL D on Thu Apr 04, 2013  2:13 PM ------      Message from: Lorenza BurtonISOM, COURTNEY B      Created: Thu Apr 04, 2013 10:50 AM      Regarding: refills       I refilled the patient's bp meds.  He also needs refills on his other medications.  The patient was also interested in having a sleep study done.  He states that he does not sleep well and will feel better if he had one done. ------

## 2013-04-18 ENCOUNTER — Ambulatory Visit: Payer: Self-pay | Admitting: Pharmacist

## 2013-04-24 ENCOUNTER — Telehealth: Payer: Self-pay | Admitting: Emergency Medicine

## 2013-04-24 ENCOUNTER — Ambulatory Visit: Payer: Self-pay | Attending: Internal Medicine | Admitting: Pharmacist

## 2013-04-24 ENCOUNTER — Encounter: Payer: Self-pay | Admitting: Pharmacist

## 2013-04-24 VITALS — BP 151/88 | HR 89 | Temp 98.0°F | Ht 71.5 in | Wt 188.0 lb

## 2013-04-24 DIAGNOSIS — R042 Hemoptysis: Secondary | ICD-10-CM | POA: Insufficient documentation

## 2013-04-24 DIAGNOSIS — I10 Essential (primary) hypertension: Secondary | ICD-10-CM | POA: Insufficient documentation

## 2013-04-24 DIAGNOSIS — Z299 Encounter for prophylactic measures, unspecified: Secondary | ICD-10-CM

## 2013-04-24 LAB — CBC WITH DIFFERENTIAL/PLATELET
Basophils Absolute: 0 10*3/uL (ref 0.0–0.1)
Basophils Relative: 1 % (ref 0–1)
Eosinophils Absolute: 0 10*3/uL (ref 0.0–0.7)
Eosinophils Relative: 1 % (ref 0–5)
HEMATOCRIT: 33.7 % — AB (ref 39.0–52.0)
HEMOGLOBIN: 10.2 g/dL — AB (ref 13.0–17.0)
Lymphocytes Relative: 26 % (ref 12–46)
Lymphs Abs: 1.2 10*3/uL (ref 0.7–4.0)
MCH: 17.7 pg — ABNORMAL LOW (ref 26.0–34.0)
MCHC: 30.3 g/dL (ref 30.0–36.0)
MCV: 58.6 fL — ABNORMAL LOW (ref 78.0–100.0)
MONO ABS: 0.7 10*3/uL (ref 0.1–1.0)
MONOS PCT: 14 % — AB (ref 3–12)
NEUTROS ABS: 2.7 10*3/uL (ref 1.7–7.7)
Neutrophils Relative %: 58 % (ref 43–77)
Platelets: 231 10*3/uL (ref 150–400)
RBC: 5.75 MIL/uL (ref 4.22–5.81)
RDW: 20 % — ABNORMAL HIGH (ref 11.5–15.5)
WBC: 4.7 10*3/uL (ref 4.0–10.5)

## 2013-04-24 LAB — COMPREHENSIVE METABOLIC PANEL
ALT: 36 U/L (ref 0–53)
AST: 32 U/L (ref 0–37)
Albumin: 4.5 g/dL (ref 3.5–5.2)
Alkaline Phosphatase: 64 U/L (ref 39–117)
BUN: 10 mg/dL (ref 6–23)
CO2: 26 mEq/L (ref 19–32)
CREATININE: 0.98 mg/dL (ref 0.50–1.35)
Calcium: 9.4 mg/dL (ref 8.4–10.5)
Chloride: 100 mEq/L (ref 96–112)
Glucose, Bld: 88 mg/dL (ref 70–99)
Potassium: 4.5 mEq/L (ref 3.5–5.3)
Sodium: 137 mEq/L (ref 135–145)
Total Bilirubin: 0.4 mg/dL (ref 0.2–1.2)
Total Protein: 7.7 g/dL (ref 6.0–8.3)

## 2013-04-24 LAB — LIPID PANEL
CHOLESTEROL: 177 mg/dL (ref 0–200)
HDL: 62 mg/dL (ref >39)
LDL Cholesterol: 96 mg/dL (ref 0–99)
TRIGLYCERIDES: 94 mg/dL (ref <150)
Total CHOL/HDL Ratio: 2.9 Ratio
VLDL: 19 mg/dL (ref 0–40)

## 2013-04-24 LAB — HEMOGLOBIN A1C
HEMOGLOBIN A1C: 6.3 % — AB (ref <5.7)
MEAN PLASMA GLUCOSE: 134 mg/dL — AB (ref <117)

## 2013-04-24 MED ORDER — ISOSORBIDE MONONITRATE ER 30 MG PO TB24: 30.0000 mg | ORAL_TABLET | Freq: Every day | ORAL | Status: AC

## 2013-04-24 NOTE — Telephone Encounter (Signed)
Pt is requesting refill on Trazadone. Please f/u

## 2013-04-24 NOTE — Progress Notes (Signed)
S:    Patient arrives to the clinic for 2 week followup ambulatory blood pressure evaluation.   Medication compliance is patient is taking daily as prescribed.  Current BP Medications include:  Lisinopril 40 mg, HCTZ 25 mg  Antihypertensives tried in the past include: Amlodipine 10 mg (allergy)  Patient returned to the clinic and reported having chest pain and coughing up blood.  O:  Last 3 Office BP readings: 171/106 mmHg 161/111 mmHg 159/103 mmHg  Today's Office BP reading: 169/78 mmHg   BMET    Component Value Date/Time   NA 135 01/28/2013 1106   K 4.5 01/28/2013 1106   CL 101 01/28/2013 1106   CO2 22 01/28/2013 1106   GLUCOSE 87 01/28/2013 1106   BUN 11 01/28/2013 1106   CREATININE 1.11 01/28/2013 1106   CREATININE 1.18 10/14/2009 1628   CALCIUM 9.2 01/28/2013 1106   GFRNONAA >60 10/10/2009 0543   GFRAA  Value: >60        The eGFR has been calculated using the MDRD equation. This calculation has not been validated in all clinical situations. eGFR's persistently <60 mL/min signify possible Chronic Kidney Disease. 10/10/2009 0543    A/P: Hypertension: Pt's blood pressure is not goal (<140/90) today.  Will add isosorbide mononitrate 30 mg daily to Lisinopril 40 mg and HCTZ 25 mg.  Will have pt followup in 2 weeks to recheck blood pressure. Labs today: CBC, CMP, Vit D, Lipids  Also, had Dr. Doreene Burke come in with pt to discuss coughing up of blood.  Pt had endoscopy last year with [facility].  Referrals were sent by Dr. Doreene Burke.

## 2013-04-25 ENCOUNTER — Telehealth: Payer: Self-pay | Admitting: Emergency Medicine

## 2013-04-25 LAB — VITAMIN D 25 HYDROXY (VIT D DEFICIENCY, FRACTURES): Vit D, 25-Hydroxy: 23 ng/mL — ABNORMAL LOW (ref 30–89)

## 2013-04-25 NOTE — Telephone Encounter (Signed)
Dr. Susie CassetteAbrol this pt is requesting refill Trazadone. Last visit 04/24/13 with Toni Amendourtney. Please f/u

## 2013-05-03 ENCOUNTER — Ambulatory Visit (HOSPITAL_COMMUNITY)
Admission: RE | Admit: 2013-05-03 | Discharge: 2013-05-03 | Disposition: A | Discharge status: Home / Self Care | Payer: Self-pay | Source: Ambulatory Visit | Attending: Internal Medicine | Admitting: Internal Medicine

## 2013-05-03 DIAGNOSIS — R042 Hemoptysis: Secondary | ICD-10-CM | POA: Insufficient documentation

## 2013-05-03 MED ORDER — IOHEXOL 350 MG/ML SOLN
100.0000 mL | Freq: Once | INTRAVENOUS | Status: AC | PRN
  Administered 2013-05-03: 100 mL via INTRAVENOUS

## 2013-05-08 ENCOUNTER — Ambulatory Visit: Payer: Self-pay | Admitting: Pharmacist

## 2013-05-08 ENCOUNTER — Telehealth: Payer: Self-pay | Admitting: Emergency Medicine

## 2013-05-08 NOTE — Telephone Encounter (Signed)
Left message with pt wife to call for results

## 2013-05-08 NOTE — Telephone Encounter (Signed)
Message copied by Darlis LoanSMITH, JILL D on Wed May 08, 2013  6:05 PM ------      Message from: Jeanann LewandowskyJEGEDE, OLUGBEMIGA E      Created: Wed May 08, 2013 12:00 PM       Please inform patient that his chest CAT scan is negative for clot in the lungs. There is sign of possible infection or inflammation in the upper left lung, he is a patient continue to call for blood there is any symptom of shortness of breath or productive cough, report back to the clinic immediately. ------

## 2013-05-09 ENCOUNTER — Ambulatory Visit (HOSPITAL_BASED_OUTPATIENT_CLINIC_OR_DEPARTMENT_OTHER): Payer: Self-pay

## 2013-05-09 ENCOUNTER — Telehealth: Payer: Self-pay | Admitting: Internal Medicine

## 2013-05-09 NOTE — Telephone Encounter (Signed)
Pt returning call

## 2013-05-10 ENCOUNTER — Telehealth: Payer: Self-pay | Admitting: Emergency Medicine

## 2013-05-10 NOTE — Telephone Encounter (Signed)
Pt given Ct scan result. States he is still experiencing coughing but denies hemoptysis. Pt ahs f/u appt scheduled 05/15/13 with Toni Amendourtney. I instructed him to go directly to ER if sx worsens with sob or prod cough. Pt verbalized understanding

## 2013-05-15 ENCOUNTER — Ambulatory Visit: Payer: Self-pay | Admitting: Pharmacist

## 2013-05-16 ENCOUNTER — Ambulatory Visit: Payer: Self-pay | Attending: Internal Medicine | Admitting: Pharmacist

## 2013-05-16 ENCOUNTER — Telehealth: Payer: Self-pay | Admitting: Internal Medicine

## 2013-05-16 ENCOUNTER — Encounter: Payer: Self-pay | Admitting: Pharmacist

## 2013-05-16 ENCOUNTER — Other Ambulatory Visit: Payer: Self-pay | Admitting: Internal Medicine

## 2013-05-16 VITALS — BP 131/80 | HR 76 | Temp 97.9°F | Wt 192.2 lb

## 2013-05-16 DIAGNOSIS — I10 Essential (primary) hypertension: Secondary | ICD-10-CM | POA: Insufficient documentation

## 2013-05-16 DIAGNOSIS — R042 Hemoptysis: Secondary | ICD-10-CM

## 2013-05-16 MED ORDER — TRAZODONE HCL 100 MG PO TABS: 100.0000 mg | ORAL_TABLET | Freq: Every evening | ORAL | Status: AC | PRN

## 2013-05-16 NOTE — Progress Notes (Signed)
S:    Patient arrives to the clinic for 3 week ambulatory blood pressure evaluation.    Medication compliance is pt taking daily.  Current BP Medications include:  HCTZ 25 mg, Isosorbide 30 mg, Lisinopril 40 mg  Antihypertensives tried in the past include: Amlodipine  Patient returned to the clinic and reported no symptoms at this time.  O:  Last 2 Office BP readings: 151/88 mmHg 176/95 mmHg   Today's Office BP reading: 131/80 mmHg   BMET    Component Value Date/Time   NA 137 04/24/2013 1136   K 4.5 04/24/2013 1136   CL 100 04/24/2013 1136   CO2 26 04/24/2013 1136   GLUCOSE 88 04/24/2013 1136   BUN 10 04/24/2013 1136   CREATININE 0.98 04/24/2013 1136   CREATININE 1.18 10/14/2009 1628   CALCIUM 9.4 04/24/2013 1136   GFRNONAA 84 01/28/2013 1106   GFRNONAA >60 10/10/2009 0543   GFRAA >89 01/28/2013 1106   GFRAA  Value: >60        The eGFR has been calculated using the MDRD equation. This calculation has not been validated in all clinical situations. eGFR's persistently <60 mL/min signify possible Chronic Kidney Disease. 10/10/2009 0543    A/P:  There are no changes at this time.  BP is at goal this visit (<140/90).  Will continue current medications and have pt follow up with provider in 3 months or prn.  Also, discussed labs from last visit with pt.  CMP was normal but A1c was 6.3%.  Explained that his A1c was abnormal and puts him at an increased risk of diabetes.  Stressed diet and exercise.    Evaluation and management procedures were performed by the Advanced Practitioner under my supervision and collaboration. I have reviewed the Advanced Practitioner's note and chart, and I agree with the management and plan.   Angelica Chessman, MD, [place], [place], [place] and [facility] [place], [place]   05/16/2013, 10:07 PM

## 2013-05-16 NOTE — Telephone Encounter (Signed)
Pt will like to know labs result.  [place]

## 2013-05-21 ENCOUNTER — Encounter (INDEPENDENT_AMBULATORY_CARE_PROVIDER_SITE_OTHER): Payer: Self-pay

## 2013-05-21 ENCOUNTER — Ambulatory Visit (INDEPENDENT_AMBULATORY_CARE_PROVIDER_SITE_OTHER): Payer: Self-pay | Admitting: Pulmonary Disease

## 2013-05-21 ENCOUNTER — Encounter: Payer: Self-pay | Admitting: Pulmonary Disease

## 2013-05-21 VITALS — BP 158/94 | HR 87 | Temp 97.9°F | Ht 71.5 in | Wt 184.2 lb

## 2013-05-21 DIAGNOSIS — R918 Other nonspecific abnormal finding of lung field: Secondary | ICD-10-CM | POA: Insufficient documentation

## 2013-05-21 DIAGNOSIS — R042 Hemoptysis: Secondary | ICD-10-CM | POA: Insufficient documentation

## 2013-05-21 NOTE — Progress Notes (Signed)
   Subjective:    Patient ID: Ramond Craverwayne C Buchmann, male    DOB: 06/02/1975, 38 y.o.   MRN: [id number]  HPI The patient is a 38 year old male who I've been asked to see for hemoptysis. The patient states that he has produced blood from his oral cavity 42 years, and feels that it is increasing in severity. He can produce blood for days, it will go away for a period, and then it returns. He has a very difficult time sorting out how much of this is produced with cough and how much is from regurgitation. He tells me that he can bring up streaks of blood or large quantities that could fill a bowl. He thinks the large quantities are coming from vomiting. The patient has a history of esophagitis, and complains of chest and epigastric pain with eating and drinking. He also has severe reflux. He has not seen a gastroenterologist since his endoscopy last year, and a recent CT scan does show abnormalities in the esophagus. The patient denies any history of TB exposure, but has never had a PPD placed. He denies any chest congestion or purulent mucus, and has not had epistaxis. He also has a dry cough with a tickle in his throat at times. The patient has ongoing smoking in small quantities.   Review of Systems  Constitutional: Positive for appetite change and unexpected weight change. Negative for fever.  HENT: Positive for congestion, sneezing, sore throat and trouble swallowing. Negative for dental problem, ear pain, nosebleeds, postnasal drip, rhinorrhea and sinus pressure.   Eyes: Negative for redness and itching.  Respiratory: Positive for cough ( hemoptysis) and shortness of breath ( rest/activity). Negative for chest tightness and wheezing.   Cardiovascular: Positive for chest pain. Negative for palpitations and leg swelling.  Gastrointestinal: Negative for nausea and vomiting.       Acid heartburn//indigestion  Genitourinary: Negative for dysuria.  Musculoskeletal: Positive for arthralgias and joint swelling.   Skin: Positive for rash ( itching).  Neurological: Positive for headaches.  Hematological: Does not bruise/bleed easily.  Psychiatric/Behavioral: Positive for dysphoric mood. The patient is nervous/anxious.        Objective:   Physical Exam Constitutional:  Well developed, no acute distress  HENT:  Nares patent without discharge, no evidence of bleeding  Oropharynx without exudate, palate and uvula are normal  Eyes:  Perrla, eomi, no scleral icterus  Neck:  No JVD, no TMG  Cardiovascular:  Normal rate, regular rhythm, no rubs or gallops.  No murmurs        Intact distal pulses  Pulmonary :  Normal breath sounds, no stridor or respiratory distress   No rales, rhonchi, or wheezing  Abdominal:  Soft, nondistended, bowel sounds present.  No tenderness noted.   Musculoskeletal:  No lower extremity edema noted.  Lymph Nodes:  No cervical lymphadenopathy noted  Skin:  No cyanosis noted  Neurologic:  Alert, appropriate, moves all 4 extremities without obvious deficit.         Assessment & Plan:

## 2013-05-21 NOTE — Assessment & Plan Note (Signed)
The patient was referred for evaluation of hemoptysis, but on careful questioning, this is most consistent with hematemesis. The patient has a history of significant esophagitis, and his CT chest shows abnormal changes in his esophagus. The patient has significant chest and epigastric pain with by mouth intake, has difficulty swallowing with eating and drinking, and has severe reflux symptoms and regurgitation of blood. I suspect the vast majority of this is not hemoptysis, but would be happy to evaluate with bronchoscopy if nothing is found from a GI standpoint. I will refer him back to his gastroenterologist.

## 2013-05-21 NOTE — Assessment & Plan Note (Addendum)
The patient has minimal inflammatory appearing change in his left upper lobe, with no infiltrate. There is nothing on the CT chest which would put him at risk for significant hemoptysis. He has no indication for pulmonary infection.  He is on an ACE inhibitor, and this may be the cause of his intermittant dry cough.

## 2013-05-21 NOTE — Patient Instructions (Signed)
Will refer to your GI doctor ([facility]) for re-evaluation.  Your history suggests your blood is coming from your GI tract rather than lungs. If your GI doctor does not find anything during their evaluation, would consider looking at your lungs with a scope for completeness.

## 2013-05-23 ENCOUNTER — Ambulatory Visit (HOSPITAL_BASED_OUTPATIENT_CLINIC_OR_DEPARTMENT_OTHER): Payer: Self-pay | Attending: Internal Medicine | Admitting: Radiology

## 2013-05-23 VITALS — Ht 71.0 in | Wt 188.0 lb

## 2013-05-23 DIAGNOSIS — G4730 Sleep apnea, unspecified: Secondary | ICD-10-CM | POA: Insufficient documentation

## 2013-05-30 ENCOUNTER — Ambulatory Visit: Payer: Self-pay

## 2013-05-30 NOTE — Telephone Encounter (Signed)
Left message with family member

## 2013-05-31 ENCOUNTER — Telehealth: Payer: Self-pay | Admitting: Pulmonary Disease

## 2013-05-31 NOTE — Telephone Encounter (Signed)
Patient was referred to [facility], pt had previously seen Dr. Dulce Sellarutlaw.  [facility].  pt's GCCN coverage has expired which would have made pt self-pay @[facility].  [facility] & is going to call [facility]  to reschedule Consultation with Dr. Dulce Sellarutlaw [facility] Lucilla Edinawne J Law

## 2013-05-31 NOTE — Telephone Encounter (Signed)
Noted.  Just as long as pt gets taken care of.

## 2013-06-01 DIAGNOSIS — G4733 Obstructive sleep apnea (adult) (pediatric): Secondary | ICD-10-CM

## 2013-06-01 NOTE — Sleep Study (Signed)
   NAME: Christopher Mcintyre DATE OF BIRTH:  09/04/1975 MEDICAL RECORD NUMBER [id number]  LOCATION: [facility]  PHYSICIAN: Clinton D Young  DATE OF STUDY: 05/23/2013  SLEEP STUDY TYPE: Nocturnal Polysomnogram               REFERRING PHYSICIAN: Jeanann LewandowskyJegede, Olugbemiga, MD  INDICATION FOR STUDY: Hypersomnia with sleep apnea  EPWORTH SLEEPINESS SCORE:  9/24 HEIGHT: 5\' 11"  (180.3 cm)  WEIGHT: 188 lb (85.276 kg)    Body mass index is 26.23 kg/(m^2).  NECK SIZE: 15.5 in.  MEDICATIONS: Charted for review  SLEEP ARCHITECTURE: Total sleep time 41 minutes with sleep efficiency 10%. Stage I was 41.5%, stage II 58.5%, stage III and REM were absent. Sleep latency 195 minutes, awake after sleep onset 172 minutes, arousal index 23.4, bedtime medication: None. He was awake almost all of the night. The technician noted frequent coughing and throat clearing throughout the night which seem to affect his sleep.  RESPIRATORY DATA: Apnea hypopnea index (AHI) 19 per hour. 13 total events scored including 10 obstructive apneas and 3 hypopneas. Events were seen in all sleep positions. CPAP was not done.  OXYGEN DATA: Moderate snoring with oxygen desaturation to a nadir of 85% and mean oxygen saturation through the study of 94.3% on room air.  CARDIAC DATA: Normal sinus rhythm  MOVEMENT/PARASOMNIA: No significant movement disturbance, bathroom x1, frequent coughing and throat clearing as noted above.  IMPRESSION/ RECOMMENDATION:   1) Marked difficulty initiating and maintaining sleep. No bedtime medication was taken. He slept only 10% of the night. The technician commented on sustained coughing and throat clearing which seemed to disturb sleep.  2) Moderate obstructive sleep apnea/hypopnea syndrome during limited sleep, non-positional. Moderate snoring with oxygen desaturation to a nadir of 85% and mean oxygen saturation through the study of 94.3% on room air. 3) This study was ordered as a  diagnostic polysomnogram without CPAP titration. If appropriate, this patient can return for a dedicated CPAP titration study-suggest bringing a sleep medication if he returns.  Signed Jetty Duhamellinton Young M.D. Waymon Budgelinton D Young Diplomate, Biomedical engineerAmerican Board of Sleep Medicine  ELECTRONICALLY SIGNED ON:  06/01/2013, 11:30 AM [facility] PH: [phone]   FX: [phone] ACCREDITED BY THE AMERICAN ACADEMY OF SLEEP MEDICINE

## 2013-06-10 ENCOUNTER — Telehealth: Payer: Self-pay | Admitting: Internal Medicine

## 2013-06-10 NOTE — Telephone Encounter (Signed)
Pt says he had sleep study done a couple of weeks ago and has not received call back regarding results. Please f/u with pt.

## 2013-06-10 NOTE — Telephone Encounter (Signed)
Pt called regarding the results of his sleep study test, please contact pt

## 2013-06-11 NOTE — Telephone Encounter (Signed)
Please result his sleep study

## 2013-06-13 NOTE — Telephone Encounter (Signed)
PT called regarding the results of his sleep study, please contact pt as soon as possible. Pt states that he has been leaving message for two days for the nurse with no return phone call.

## 2013-06-14 NOTE — Telephone Encounter (Signed)
Pt called regarding his Sleep Study results, please contact pt as soon as possible.

## 2013-06-19 ENCOUNTER — Telehealth: Payer: Self-pay | Admitting: Internal Medicine

## 2013-06-19 NOTE — Telephone Encounter (Signed)
Pt called requesting the results of his sleep study, please contact pt. Pt has been calling for a week requesting his results.

## 2013-06-20 ENCOUNTER — Telehealth: Payer: Self-pay | Admitting: *Deleted

## 2013-06-20 NOTE — Telephone Encounter (Signed)
Patient called in for Sleep Study Results wanting a copy. Informed patient he will have to come in and request results in person.

## 2013-06-27 NOTE — Telephone Encounter (Signed)
I spoke with the wife and I told her that he should call [facility] for they are the ones that will result his sleep study.

## 2013-08-05 ENCOUNTER — Ambulatory Visit: Payer: Self-pay | Attending: Internal Medicine | Admitting: Internal Medicine

## 2013-08-05 ENCOUNTER — Encounter: Payer: Self-pay | Admitting: Internal Medicine

## 2013-08-05 VITALS — BP 135/90 | HR 85 | Temp 98.6°F | Resp 16 | Ht 71.5 in | Wt 189.0 lb

## 2013-08-05 DIAGNOSIS — K219 Gastro-esophageal reflux disease without esophagitis: Secondary | ICD-10-CM | POA: Insufficient documentation

## 2013-08-05 DIAGNOSIS — I10 Essential (primary) hypertension: Secondary | ICD-10-CM | POA: Insufficient documentation

## 2013-08-05 DIAGNOSIS — M5126 Other intervertebral disc displacement, lumbar region: Secondary | ICD-10-CM | POA: Insufficient documentation

## 2013-08-05 DIAGNOSIS — M5127 Other intervertebral disc displacement, lumbosacral region: Secondary | ICD-10-CM

## 2013-08-05 DIAGNOSIS — G4730 Sleep apnea, unspecified: Secondary | ICD-10-CM | POA: Insufficient documentation

## 2013-08-05 LAB — LIPID PANEL
CHOL/HDL RATIO: 3.1 ratio
CHOLESTEROL: 182 mg/dL (ref 0–200)
HDL: 59 mg/dL (ref >39)
LDL Cholesterol: 84 mg/dL (ref 0–99)
TRIGLYCERIDES: 194 mg/dL — AB (ref <150)
VLDL: 39 mg/dL (ref 0–40)

## 2013-08-05 LAB — POCT GLYCOSYLATED HEMOGLOBIN (HGB A1C): HEMOGLOBIN A1C: 5.1

## 2013-08-05 MED ORDER — RANITIDINE HCL 150 MG PO TABS: 150.0000 mg | ORAL_TABLET | Freq: Two times a day (BID) | ORAL | Status: AC

## 2013-08-05 MED ORDER — VITAMIN D (ERGOCALCIFEROL) 1.25 MG (50000 UNIT) PO CAPS: 50000.0000 [IU] | ORAL_CAPSULE | ORAL | Status: AC

## 2013-08-05 MED ORDER — PANTOPRAZOLE SODIUM 40 MG PO TBEC: 40.0000 mg | DELAYED_RELEASE_TABLET | Freq: Every day | ORAL | Status: DC

## 2013-08-05 MED ORDER — ACETAMINOPHEN-CODEINE #3 300-30 MG PO TABS: 1.0000 | ORAL_TABLET | ORAL | Status: AC | PRN

## 2013-08-05 NOTE — Progress Notes (Signed)
Pt is here following up on his chronic lower back pain. Pt recently had a sleep study and would like to review his results.

## 2013-08-05 NOTE — Patient Instructions (Signed)
DASH Eating Plan DASH stands for "Dietary Approaches to Stop Hypertension." The DASH eating plan is a healthy eating plan that has been shown to reduce high blood pressure (hypertension). Additional health benefits may include reducing the risk of type 2 diabetes mellitus, heart disease, and stroke. The DASH eating plan may also help with weight loss. WHAT DO I NEED TO KNOW ABOUT THE DASH EATING PLAN? For the DASH eating plan, you will follow these general guidelines:  Choose foods with a percent daily value for sodium of less than 5% (as listed on the food label).  Use salt-free seasonings or herbs instead of table salt or sea salt.  Check with your health care provider or pharmacist before using salt substitutes.  Eat lower-sodium products, often labeled as "lower sodium" or "no salt added."  Eat fresh foods.  Eat more vegetables, fruits, and low-fat dairy products.  Choose whole grains. Look for the word "whole" as the first word in the ingredient list.  Choose fish and skinless chicken or turkey more often than red meat. Limit fish, poultry, and meat to 6 oz (170 g) each day.  Limit sweets, desserts, sugars, and sugary drinks.  Choose heart-healthy fats.  Limit cheese to 1 oz (28 g) per day.  Eat more home-cooked food and less restaurant, buffet, and fast food.  Limit fried foods.  Cook foods using methods other than frying.  Limit canned vegetables. If you do use them, rinse them well to decrease the sodium.  When eating at a restaurant, ask that your food be prepared with less salt, or no salt if possible. WHAT FOODS CAN I EAT? Seek help from a dietitian for individual calorie needs. Grains Whole grain or whole wheat bread. Brown rice. Whole grain or whole wheat pasta. Quinoa, bulgur, and whole grain cereals. Low-sodium cereals. Corn or whole wheat flour tortillas. Whole grain cornbread. Whole grain crackers. Low-sodium crackers. Vegetables Fresh or frozen vegetables  (raw, steamed, roasted, or grilled). Low-sodium or reduced-sodium tomato and vegetable juices. Low-sodium or reduced-sodium tomato sauce and paste. Low-sodium or reduced-sodium canned vegetables.  Fruits All fresh, canned (in natural juice), or frozen fruits. Meat and Other Protein Products Ground beef (85% or leaner), grass-fed beef, or beef trimmed of fat. Skinless chicken or turkey. Ground chicken or turkey. Pork trimmed of fat. All fish and seafood. Eggs. Dried beans, peas, or lentils. Unsalted nuts and seeds. Unsalted canned beans. Dairy Low-fat dairy products, such as skim or 1% milk, 2% or reduced-fat cheeses, low-fat ricotta or cottage cheese, or plain low-fat yogurt. Low-sodium or reduced-sodium cheeses. Fats and Oils Tub margarines without trans fats. Light or reduced-fat mayonnaise and salad dressings (reduced sodium). Avocado. Safflower, olive, or canola oils. Natural peanut or almond butter. Other Unsalted popcorn and pretzels. The items listed above may not be a complete list of recommended foods or beverages. Contact your dietitian for more options. WHAT FOODS ARE NOT RECOMMENDED? Grains White bread. White pasta. White rice. Refined cornbread. Bagels and croissants. Crackers that contain trans fat. Vegetables Creamed or fried vegetables. Vegetables in a cheese sauce. Regular canned vegetables. Regular canned tomato sauce and paste. Regular tomato and vegetable juices. Fruits Dried fruits. Canned fruit in light or heavy syrup. Fruit juice. Meat and Other Protein Products Fatty cuts of meat. Ribs, chicken wings, bacon, sausage, bologna, salami, chitterlings, fatback, hot dogs, bratwurst, and packaged luncheon meats. Salted nuts and seeds. Canned beans with salt. Dairy Whole or 2% milk, cream, half-and-half, and cream cheese. Whole-fat or sweetened yogurt. Full-fat   cheeses or blue cheese. Nondairy creamers and whipped toppings. Processed cheese, cheese spreads, or cheese  curds. Condiments Onion and garlic salt, seasoned salt, table salt, and sea salt. Canned and packaged gravies. Worcestershire sauce. Tartar sauce. Barbecue sauce. Teriyaki sauce. Soy sauce, including reduced sodium. Steak sauce. Fish sauce. Oyster sauce. Cocktail sauce. Horseradish. Ketchup and mustard. Meat flavorings and tenderizers. Bouillon cubes. Hot sauce. Tabasco sauce. Marinades. Taco seasonings. Relishes. Fats and Oils Butter, stick margarine, lard, shortening, ghee, and bacon fat. Coconut, palm kernel, or palm oils. Regular salad dressings. Other Pickles and olives. Salted popcorn and pretzels. The items listed above may not be a complete list of foods and beverages to avoid. Contact your dietitian for more information. WHERE CAN I FIND MORE INFORMATION? National Heart, Lung, and Blood Institute: www.nhlbi.nih.gov/health/health-topics/topics/dash/ Document Released: 01/06/2011 Document Revised: 01/22/2013 Document Reviewed: 11/21/2012 ExitCare Patient Information 2015 Exit[employer]. This information is not intended to replace advice given to you by your health care provider. Make sure you discuss any questions you have with your health care provider. Hypertension Hypertension, commonly called high blood pressure, is when the force of blood pumping through your arteries is too strong. Your arteries are the blood vessels that carry blood from your heart throughout your body. A blood pressure reading consists of a higher number over a lower number, such as 110/72. The higher number (systolic) is the pressure inside your arteries when your heart pumps. The lower number (diastolic) is the pressure inside your arteries when your heart relaxes. Ideally you want your blood pressure below 120/80. Hypertension forces your heart to work harder to pump blood. Your arteries may become narrow or stiff. Having hypertension puts you at risk for heart disease, stroke, and other problems.  RISK  FACTORS Some risk factors for high blood pressure are controllable. Others are not.  Risk factors you cannot control include:   Race. You may be at higher risk if you are African American.  Age. Risk increases with age.  Gender. Men are at higher risk than women before age 45 years. After age 65, women are at higher risk than men. Risk factors you can control include:  Not getting enough exercise or physical activity.  Being overweight.  Getting too much fat, sugar, calories, or salt in your diet.  Drinking too much alcohol. SIGNS AND SYMPTOMS Hypertension does not usually cause signs or symptoms. Extremely high blood pressure (hypertensive crisis) may cause headache, anxiety, shortness of breath, and nosebleed. DIAGNOSIS  To check if you have hypertension, your health care provider will measure your blood pressure while you are seated, with your arm held at the level of your heart. It should be measured at least twice using the same arm. Certain conditions can cause a difference in blood pressure between your right and left arms. A blood pressure reading that is higher than normal on one occasion does not mean that you need treatment. If one blood pressure reading is high, ask your health care provider about having it checked again. TREATMENT  Treating high blood pressure includes making lifestyle changes and possibly taking medication. Living a healthy lifestyle can help lower high blood pressure. You may need to change some of your habits. Lifestyle changes may include:  Following the DASH diet. This diet is high in fruits, vegetables, and whole grains. It is low in salt, red meat, and added sugars.  Getting at least 2 1/2 hours of brisk physical activity every week.  Losing weight if necessary.  Not smoking.    Limiting alcoholic beverages.  Learning ways to reduce stress. If lifestyle changes are not enough to get your blood pressure under control, your health care provider  may prescribe medicine. You may need to take more than one. Work closely with your health care provider to understand the risks and benefits. HOME CARE INSTRUCTIONS  Have your blood pressure rechecked as directed by your health care provider.   Only take medicine as directed by your health care provider. Follow the directions carefully. Blood pressure medicines must be taken as prescribed. The medicine does not work as well when you skip doses. Skipping doses also puts you at risk for problems.   Do not smoke.   Monitor your blood pressure at home as directed by your health care provider. SEEK MEDICAL CARE IF:   You think you are having a reaction to medicines taken.  You have recurrent headaches or feel dizzy.  You have swelling in your ankles.  You have trouble with your vision. SEEK IMMEDIATE MEDICAL CARE IF:  You develop a severe headache or confusion.  You have unusual weakness, numbness, or feel faint.  You have severe chest or abdominal pain.  You vomit repeatedly.  You have trouble breathing. MAKE SURE YOU:   Understand these instructions.  Will watch your condition.  Will get help right away if you are not doing well or get worse. Document Released: 01/17/2005 Document Revised: 01/22/2013 Document Reviewed: 11/09/2012 ExitCare Patient Information 2015 Exit[employer], [employer]. This information is not intended to replace advice given to you by your health care provider. Make sure you discuss any questions you have with your health care provider.  

## 2013-08-05 NOTE — Progress Notes (Signed)
Patient ID: Christopher Mcintyre, male   DOB: 07/29/1975, 38 y.o.   MRN: [id number]   Christopher Mcintyre, is a 38 y.o. male  NUU:[id number]:[id number]  HKV:[id number]  DOB - 05/25/1975  Chief Complaint  Patient presents with  . Follow-up        Subjective:   Christopher Mcintyre is a 38 y.o. male here today for a follow up visit. Patient is known to have hypertension, major depression, GERD, chronic low back pain and obstructive sleep apnea. She had a sleep study done recently that showed multiple episodes of apnea, it was reported that he slept only 10% of the night, repeatedly woken up because of cough. Patient is here today to review the next, he is required to do is to separate study for CPAP titration. He also, complaining of ongoing low back pain and needs a refill of pain medication, he will also want to be referred to pain clinic. She continue to smoke at least a quarter of a pack of cigarette per day, he does not drink alcohol. Patient has No headache, No chest pain, No abdominal pain - No Nausea, No new weakness tingling or numbness, No Cough - SOB.  Problem  Htn (Hypertension), Benign  Unspecified Sleep Apnea    ALLERGIES: Allergies  Allergen Reactions  . Amlodipine Rash    PAST MEDICAL HISTORY: Past Medical History  Diagnosis Date  . Hypertension   . Allergy     seasonal allergies-tx. Flonase Spray  . Depression   . Reflux   . Anxiety   . Sleep apnea     "was told this", no study done  . GERD (gastroesophageal reflux disease)   . Headache(784.0)     migraines-last 3 weeks ago.  . Arthritis     DDD-lumbar, tx. neurontin,bilateral tingling ,? rt. foot drop,heaviness of left leg  . H/O sickle cell trait 08-21-12    son age 667 has sickle cell disease, 2 daughters-has trait.  Marland Kitchen. Shortness of breath     on exertion    MEDICATIONS AT HOME: Prior to Admission medications   Medication Sig Start Date End Date Taking? Authorizing Provider  acyclovir (ZOVIRAX) 400 MG tablet Take 1 tablet (400  mg total) by mouth 5 (five) times daily. 04/04/13  Yes Jeanann Lewandowskylugbemiga Feliz Herard, MD  ferrous sulfate 325 (65 FE) MG tablet Take 325 mg by mouth daily as needed (for iron).   Yes Historical Provider, MD  fluticasone (FLONASE) 50 MCG/ACT nasal spray Place 2 sprays into the nose daily. 09/27/12  Yes Dorothea OgleIskra M Myers, MD  gabapentin (NEURONTIN) 300 MG capsule Take 1 capsule (300 mg total) by mouth 3 (three) times daily. 04/04/13  Yes Jeanann Lewandowskylugbemiga Jessina Marse, MD  isosorbide mononitrate (IMDUR) 30 MG 24 hr tablet Take 1 tablet (30 mg total) by mouth daily. 04/24/13  Yes Jeanann Lewandowskylugbemiga Stashia Sia, MD  lisinopril (PRINIVIL,ZESTRIL) 40 MG tablet Take 1 tablet (40 mg total) by mouth daily. 04/04/13  Yes Jeanann Lewandowskylugbemiga Harika Laidlaw, MD  pantoprazole (PROTONIX) 40 MG tablet Take 1 tablet (40 mg total) by mouth daily. 08/05/13  Yes Jeanann Lewandowskylugbemiga Mattilyn Crites, MD  polyvinyl alcohol (LIQUIFILM TEARS) 1.4 % ophthalmic solution Place 1 drop into both eyes daily as needed (for dry eyes).   Yes Historical Provider, MD  QUEtiapine (SEROQUEL) 100 MG tablet Take 100 mg by mouth at bedtime.   Yes Historical Provider, MD  sertraline (ZOLOFT) 100 MG tablet Take 1 tablet (100 mg total) by mouth daily at 12 noon. 04/04/13  Yes Jeanann Lewandowskylugbemiga Ismael Karge, MD  traZODone (DESYREL) 100 MG  tablet Take 1 tablet (100 mg total) by mouth at bedtime as needed for sleep. 05/16/13  Yes Jeanann Lewandowskylugbemiga Tarius Stangelo, MD  Vitamin D, Ergocalciferol, (DRISDOL) 50000 UNITS CAPS capsule Take 1 capsule (50,000 Units total) by mouth every 7 (seven) days. 08/05/13  Yes Jeanann Lewandowskylugbemiga Kaelei Wheeler, MD  acetaminophen-codeine (TYLENOL #3) 300-30 MG per tablet Take 1 tablet by mouth every 4 (four) hours as needed. 08/05/13   Jeanann Lewandowskylugbemiga Yoshio Seliga, MD  hydrochlorothiazide (HYDRODIURIL) 25 MG tablet Take 1 tablet (25 mg total) by mouth daily. 04/04/13   Jeanann Lewandowskylugbemiga Waunetta Riggle, MD  ranitidine (ZANTAC) 150 MG tablet Take 1 tablet (150 mg total) by mouth 2 (two) times daily. 08/05/13   Jeanann Lewandowskylugbemiga Roniesha Hollingshead, MD     Objective:   Filed Vitals:   08/05/13 1428   BP: 135/90  Pulse: 85  Temp: 98.6 F (37 C)  TempSrc: Oral  Resp: 16  Height: 5' 11.5" (1.816 m)  Weight: 189 lb (85.73 kg)  SpO2: 98%    Exam General appearance : Awake, alert, not in any distress. Speech Clear. Not toxic looking HEENT: Atraumatic and Normocephalic, pupils equally reactive to light and accomodation Neck: supple, no JVD. No cervical lymphadenopathy.  Chest:Good air entry bilaterally, no added sounds  CVS: S1 S2 regular, no murmurs.  Abdomen: Bowel sounds present, Non tender and not distended with no gaurding, rigidity or rebound. Extremities: B/L Lower Ext shows no edema, both legs are warm to touch Neurology: Awake alert, and oriented X 3, CN II-XII intact, Non focal Skin:No Rash Wounds:N/A  Data Review Lab Results  Component Value Date   HGBA1C 5.1 08/05/2013   HGBA1C 6.3* 04/24/2013   HGBA1C 5.6 07/05/2012     Assessment & Plan   1. HTN (hypertension), benign  - POCT glycosylated hemoglobin (Hb A1C) 5.1% today - Lipid panel - CBC with Differential  2. Unspecified sleep apnea  - Cpap titration; Future scheduled for 09/04/2013  3. Lumbosacral disc herniation  - Vitamin D, Ergocalciferol, (DRISDOL) 50000 UNITS CAPS capsule; Take 1 capsule (50,000 Units total) by mouth every 7 (seven) days.  Dispense: 10 capsule; Refill: 0 - Ambulatory referral to Pain Clinic - acetaminophen-codeine (TYLENOL #3) 300-30 MG per tablet; Take 1 tablet by mouth every 4 (four) hours as needed.  Dispense: 60 tablet; Refill: 0  4. Gastroesophageal reflux disease without esophagitis  - pantoprazole (PROTONIX) 40 MG tablet; Take 1 tablet (40 mg total) by mouth daily.  Dispense: 30 tablet; Refill: 3 - ranitidine (ZANTAC) 150 MG tablet; Take 1 tablet (150 mg total) by mouth 2 (two) times daily.  Dispense: 60 tablet; Refill: 3  Patient was extensively counseled on smoking cessation Patient was counseled extensively about nutrition and exercise  Return in about 6 months  (around 02/05/2014) for Hemoglobin A1C and Follow up, DM, Follow up HTN, Follow up Pain and comorbidities.  The patient was given clear instructions to go to ER or return to medical center if symptoms don't improve, worsen or new problems develop. The patient verbalized understanding. The patient was told to call to get lab results if they haven't heard anything in the next week.   This note has been created with Education officer, environmentalDragon speech recognition software and smart phrase technology. Any transcriptional errors are unintentional.    Jeanann LewandowskyJEGEDE, Numa Schroeter, MD, MHA, FACP, FAAP [facility] [place], [place] [phone]   08/05/2013, 5:36 PM

## 2013-08-06 LAB — CBC WITH DIFFERENTIAL/PLATELET
BASOS ABS: 0 10*3/uL (ref 0.0–0.1)
Basophils Relative: 1 % (ref 0–1)
EOS ABS: 0.1 10*3/uL (ref 0.0–0.7)
EOS PCT: 2 % (ref 0–5)
HCT: 31.7 % — ABNORMAL LOW (ref 39.0–52.0)
Hemoglobin: 9.8 g/dL — ABNORMAL LOW (ref 13.0–17.0)
LYMPHS PCT: 25 % (ref 12–46)
Lymphs Abs: 1.2 10*3/uL (ref 0.7–4.0)
MCH: 17.7 pg — AB (ref 26.0–34.0)
MCHC: 30.9 g/dL (ref 30.0–36.0)
MCV: 57.2 fL — AB (ref 78.0–100.0)
Monocytes Absolute: 0.5 10*3/uL (ref 0.1–1.0)
Monocytes Relative: 11 % (ref 3–12)
Neutro Abs: 2.9 10*3/uL (ref 1.7–7.7)
Neutrophils Relative %: 61 % (ref 43–77)
PLATELETS: 291 10*3/uL (ref 150–400)
RBC: 5.54 MIL/uL (ref 4.22–5.81)
RDW: 19.5 % — AB (ref 11.5–15.5)
WBC: 4.8 10*3/uL (ref 4.0–10.5)

## 2013-08-14 ENCOUNTER — Telehealth: Payer: Self-pay | Admitting: Emergency Medicine

## 2013-08-14 NOTE — Telephone Encounter (Signed)
Left message for pt to call for lab results 

## 2013-08-14 NOTE — Telephone Encounter (Signed)
Message copied by Darlis LoanSMITH, JILL D on Wed Aug 14, 2013  6:04 PM ------      Message from: Jeanann LewandowskyJEGEDE, OLUGBEMIGA E      Created: Wed Aug 14, 2013  5:26 PM       Please inform patient that her laboratory tests results shows evidence of iron deficiency anemia, will encourage her to continue on iron supplement and will continue to monitor the hemoglobin level. Other test results are within normal limits. ------

## 2013-08-30 ENCOUNTER — Telehealth: Payer: Self-pay | Admitting: Internal Medicine

## 2013-08-30 NOTE — Telephone Encounter (Signed)
Pt returning call regarding results, pls f/u with pt.  °

## 2013-09-04 ENCOUNTER — Ambulatory Visit (HOSPITAL_BASED_OUTPATIENT_CLINIC_OR_DEPARTMENT_OTHER): Payer: Self-pay | Attending: Internal Medicine | Admitting: Radiology

## 2013-09-04 VITALS — Ht 71.0 in | Wt 185.0 lb

## 2013-09-04 DIAGNOSIS — G4700 Insomnia, unspecified: Secondary | ICD-10-CM | POA: Insufficient documentation

## 2013-09-04 DIAGNOSIS — G4730 Sleep apnea, unspecified: Secondary | ICD-10-CM

## 2013-09-04 DIAGNOSIS — G4733 Obstructive sleep apnea (adult) (pediatric): Secondary | ICD-10-CM

## 2013-09-08 DIAGNOSIS — G4733 Obstructive sleep apnea (adult) (pediatric): Secondary | ICD-10-CM

## 2013-09-08 NOTE — Sleep Study (Signed)
   NAME: Christopher Mcintyre DATE OF BIRTH:  11/07/1975 MEDICAL RECORD NUMBER [id number]  LOCATION: [facility]  PHYSICIAN: YOUNG,CLINTON D  DATE OF STUDY: 09/04/2013  SLEEP STUDY TYPE: Nocturnal Polysomnogram-CPAP titration               REFERRING PHYSICIAN: Jeanann LewandowskyJegede, Olugbemiga, MD  INDICATION FOR STUDY: Insomnia with sleep apnea  EPWORTH SLEEPINESS SCORE:   16/24 HEIGHT: 5\' 11"  (180.3 cm)  WEIGHT: 185 lb (83.915 kg)    Body mass index is 25.81 kg/(m^2).  NECK SIZE: 16 in.  MEDICATIONS: Charted for review  SLEEP ARCHITECTURE: Total sleep time 291 minutes with sleep efficiency 72.8%. Stage I was 15.5%, stage II 58.9%, stage III absent, REM 25.6% of total sleep time. Sleep latency 81 minutes, REM latency 67 minutes, awake after sleep onset 28.5 minutes, arousal index 23.3, bedtime medication: Seroquel, tramadol  RESPIRATORY DATA: CPAP titration protocol. CPAP titrated to 17 CWP, AHI 0 per hour. He wore a medium fullface mask.  OXYGEN DATA: Snoring was prevented at final CPAP with mean oxygen saturation 97.6% on room air.  CARDIAC DATA: Normal sinus rhythm  MOVEMENT/PARASOMNIA: No significant movement disturbance, bathroom x1  IMPRESSION/ RECOMMENDATION:   1) Successful CPAP titration to 17 CWP, AHI 0 per hour. He wore a medium [employer] Simplus fullface mask with heated humidifier and an EPR of 2. Snoring was prevented and mean oxygen saturation was 97.6% on room air.  2) Baseline polysomnogram on 05/23/2013 record AHI 19 per hour with body weight 188 pounds. He had significant difficulty initiating and maintaining sleep on that study. 3) The patient describes chronic cough and waking at night choking which affect sleep. Consider if esophageal reflux is part of this problem.   Waymon BudgeYOUNG,CLINTON D Diplomate, American Board of Sleep Medicine  ELECTRONICALLY SIGNED ON:  09/08/2013, 12:27 PM [facility] PH: [phone]   FX: [phone]  [phone] ACCREDITED BY THE AMERICAN ACADEMY OF SLEEP MEDICINE

## 2013-10-28 ENCOUNTER — Telehealth: Payer: Self-pay | Admitting: Internal Medicine

## 2013-10-28 NOTE — Telephone Encounter (Signed)
Patient call stating that he has sleep apnea and that in order for him to receive a cPAP machine his provider must fill out a form online. Patient advised to bring or fax a hardcopy of the form to the office for completion. Patient also advised that it could take up to 14 days from the date that he drops it off (or faxes it) to be completed. Patient further insist that he was told by a representative at the [employer] (who the form is needed by) that the only way to complete this form is online. I tried contacting [employer] at the number provided by the patient [phone]. However, they were closed for the day. Please follow up with patient to confirm/discuss. Thank you.

## 2013-10-28 NOTE — Telephone Encounter (Signed)
Pt is wondering if he needs to make an appointment to get this situated. He has not been able to sleep properly and urgently needs to get this situated asap. Please follow up with pt.

## 2014-05-13 ENCOUNTER — Ambulatory Visit: Payer: Self-pay | Admitting: Internal Medicine

## 2015-02-01 IMAGING — CT CT ANGIO CHEST
2 of 8 series · 19 of 46 positions shown · IV contrast (omnipaque)
Comparison: 10/09/2009

CLINICAL DATA: Chest pain and hemoptysis.

EXAM:
CT ANGIOGRAPHY CHEST WITH CONTRAST
TECHNIQUE: Multidetector CT imaging of the chest was performed using the
standard protocol during bolus administration of intravenous
contrast. Multiplanar CT image reconstructions and MIPs were
obtained to evaluate the vascular anatomy.
CONTRAST:  100mL OMNIPAQUE IOHEXOL 350 MG/ML SOLN

[Series 5: thins · axial · 0.78mm/px · z∈[-310,-5]mm · 17 of 345 slices shown]
[im 20/345  lung]
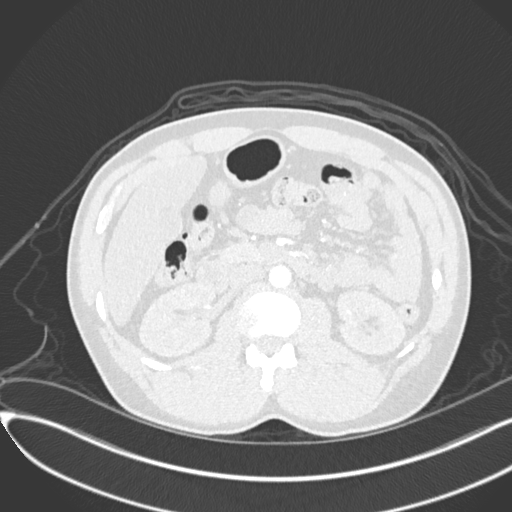
[im 39/345  soft-tissue]
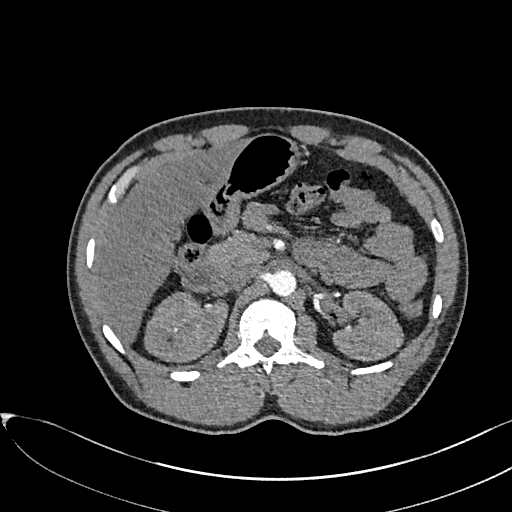
[im 58/345  lung]
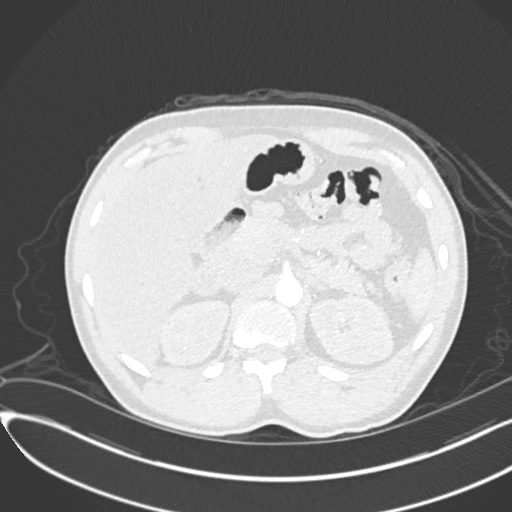
[im 77/345  soft-tissue]
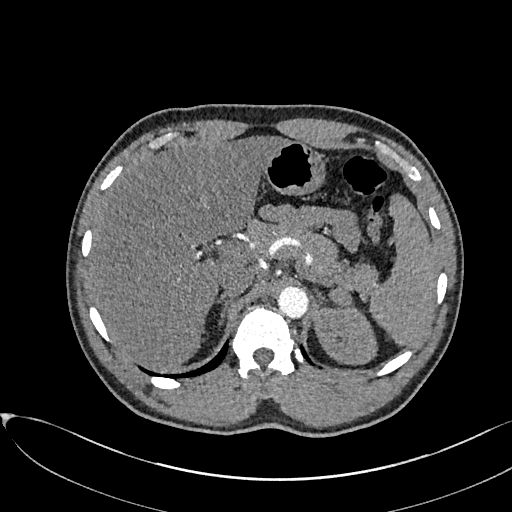
[im 96/345  lung]
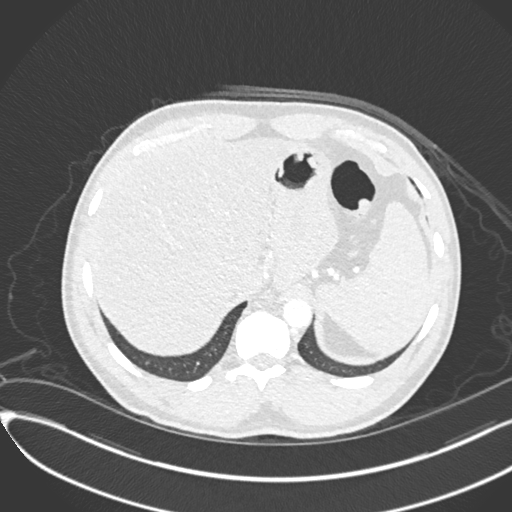
[im 115/345  soft-tissue]
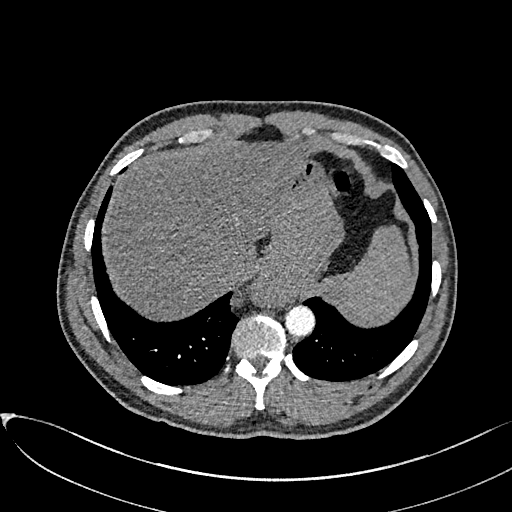
[im 134/345  lung]
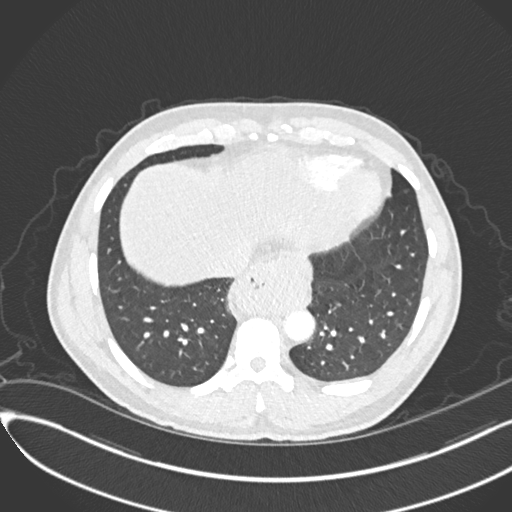
[im 153/345  soft-tissue]
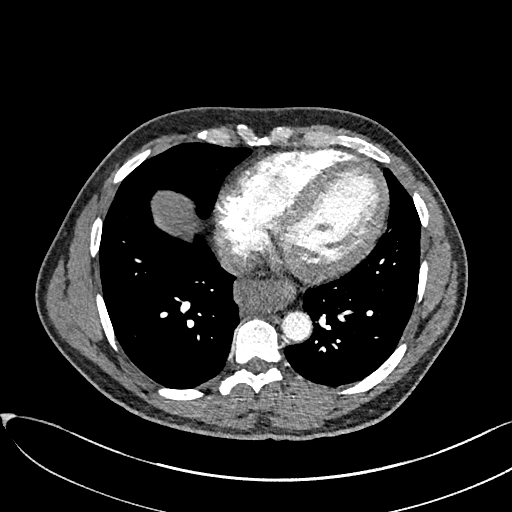
[im 173/345  lung]
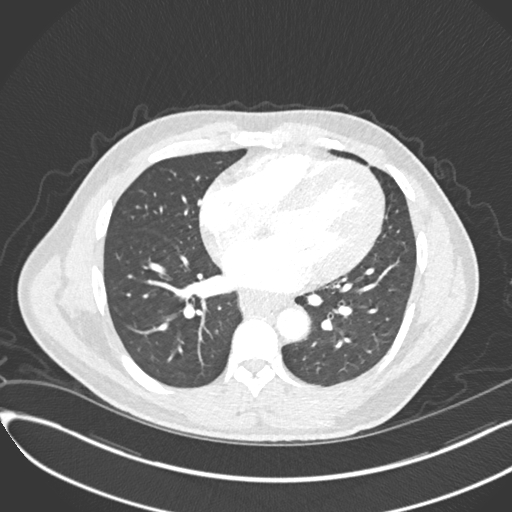
[im 192/345  soft-tissue]
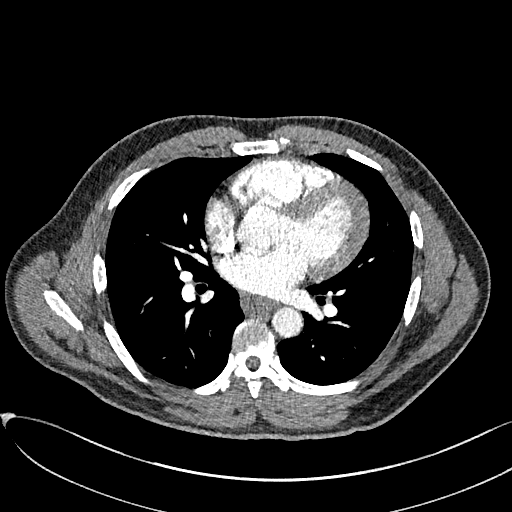
[im 211/345  lung]
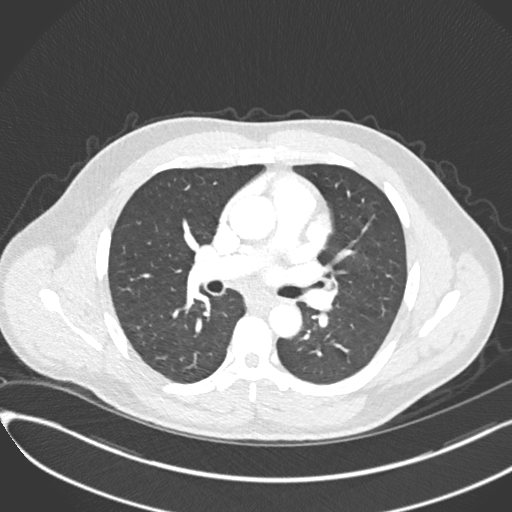
[im 230/345  soft-tissue]
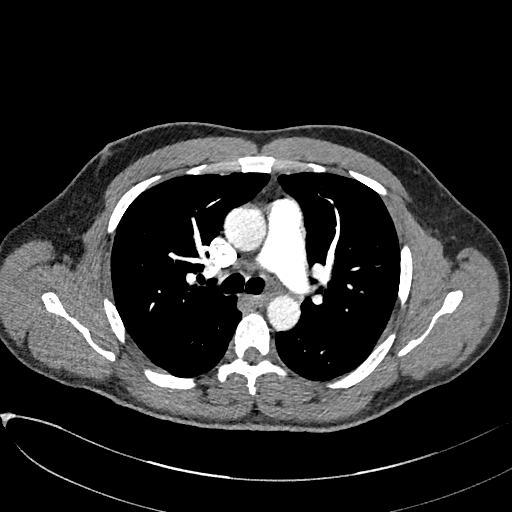
[im 249/345  lung]
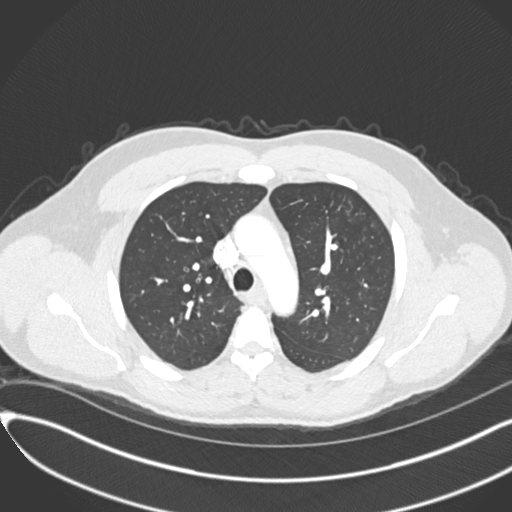
[im 268/345  soft-tissue]
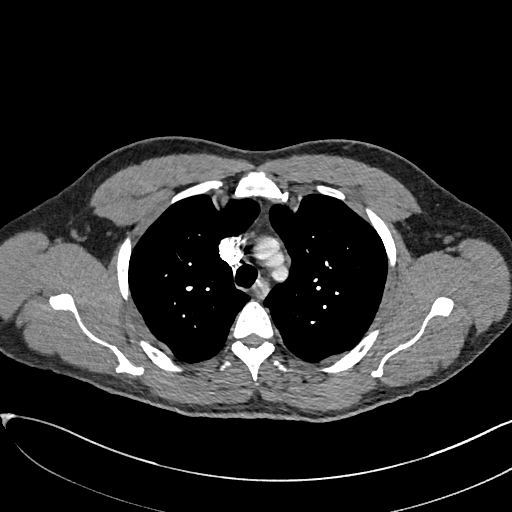
[im 287/345  lung]
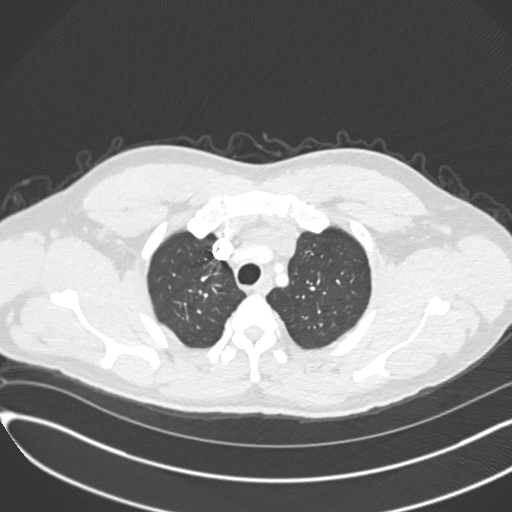
[im 306/345  soft-tissue]
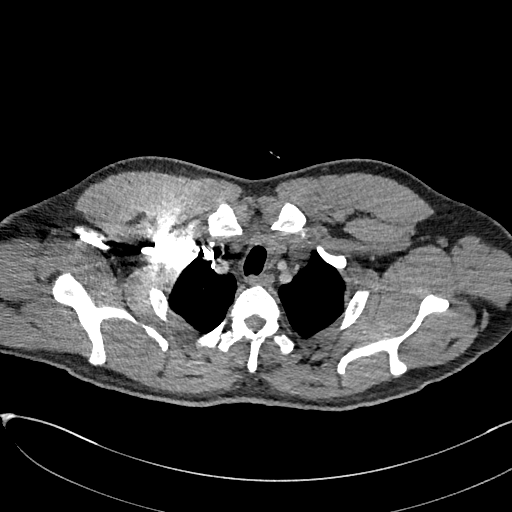
[im 325/345  lung]
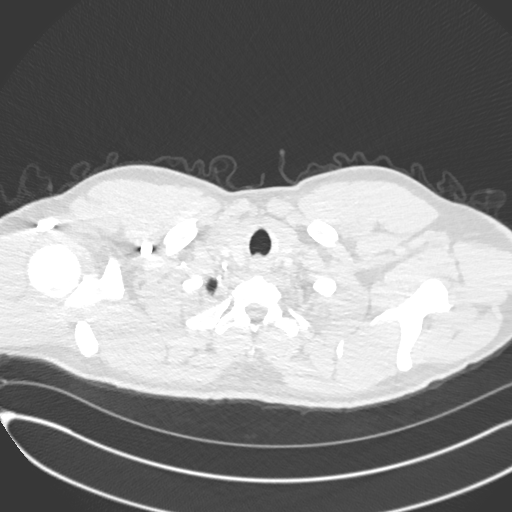

[Series 6: lung · axial · 0.78mm/px · z∈[-223,-148]mm · 2 of 102 slices shown]
[im 26/102  soft-tissue]
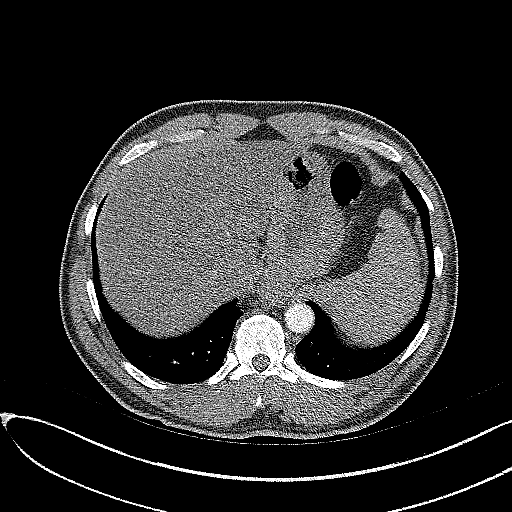
[im 51/102  soft-tissue]
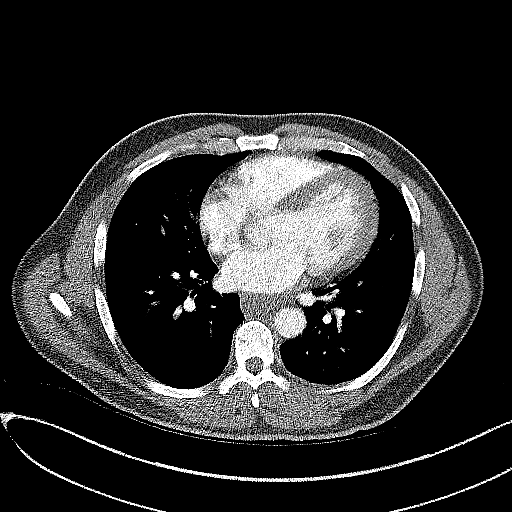

[19 of 46 positions shown; findings below may reference images not displayed]

FINDINGS: There is no filling defect within the opacified pulmonary arteries
to suggest the presence of acute pulmonary embolus. No thoracic
aortic aneurysm. No dissection of the thoracic aorta.

No lymphadenopathy in the chest. Moderate hiatal hernia noted with
associated mild to moderate circumferential wall thickening in the
mid and distal esophagus. Small lymph nodes adjacent to the proximal
stomach are stable in the interval.

Lung windows show patchy areas of subtle tree in bud opacity in the
central left upper lobe. Lungs are otherwise clear.

Bone windows reveal no worrisome lytic or sclerotic osseous lesions.
Diffuse low-attenuation of the liver parenchyma is compatible with
steatosis.

Review of the MIP images confirms the above findings.
IMPRESSION: No CT evidence for acute pulmonary embolus.

Patchy subtle tree in bud opacity in the left upper lobe. This may
be related to an infectious or inflammatory alveolitis. Atypical
infection would be a consideration.

Moderate hiatal hernia with circumferential wall thickening in the
distal esophagus. CT imaging features suggest esophagitis, but a
similar appearance was noted on the prior study. The small lymph
nodes adjacent to the proximal stomach are unchanged in the 3 year
interval since the prior study.

Steatosis.  .

## 2016-02-09 ENCOUNTER — Emergency Department (HOSPITAL_COMMUNITY)
Admission: EM | Admit: 2016-02-09 | Discharge: 2016-02-10 | Disposition: A | Discharge status: Home / Self Care | Payer: Self-pay | Attending: Emergency Medicine | Admitting: Emergency Medicine

## 2016-02-09 ENCOUNTER — Emergency Department (HOSPITAL_COMMUNITY): Payer: Self-pay

## 2016-02-09 ENCOUNTER — Encounter (HOSPITAL_COMMUNITY): Payer: Self-pay | Admitting: *Deleted

## 2016-02-09 DIAGNOSIS — R079 Chest pain, unspecified: Secondary | ICD-10-CM | POA: Insufficient documentation

## 2016-02-09 DIAGNOSIS — I10 Essential (primary) hypertension: Secondary | ICD-10-CM | POA: Insufficient documentation

## 2016-02-09 DIAGNOSIS — Z79899 Other long term (current) drug therapy: Secondary | ICD-10-CM | POA: Insufficient documentation

## 2016-02-09 DIAGNOSIS — F17210 Nicotine dependence, cigarettes, uncomplicated: Secondary | ICD-10-CM | POA: Insufficient documentation

## 2016-02-09 DIAGNOSIS — K219 Gastro-esophageal reflux disease without esophagitis: Secondary | ICD-10-CM | POA: Insufficient documentation

## 2016-02-09 LAB — CBC
HCT: 29.4 % — ABNORMAL LOW (ref 39.0–52.0)
HEMOGLOBIN: 8.4 g/dL — AB (ref 13.0–17.0)
MCH: 17 pg — AB (ref 26.0–34.0)
MCHC: 28.6 g/dL — ABNORMAL LOW (ref 30.0–36.0)
MCV: 59.6 fL — ABNORMAL LOW (ref 78.0–100.0)
Platelets: 150 10*3/uL (ref 150–400)
RBC: 4.93 MIL/uL (ref 4.22–5.81)
RDW: 21.3 % — ABNORMAL HIGH (ref 11.5–15.5)
WBC: 7.6 10*3/uL (ref 4.0–10.5)

## 2016-02-09 LAB — I-STAT TROPONIN, ED
TROPONIN I, POC: 0 ng/mL (ref 0.00–0.08)
Troponin i, poc: 0.02 ng/mL (ref 0.00–0.08)

## 2016-02-09 LAB — BASIC METABOLIC PANEL
Anion gap: 8 (ref 5–15)
BUN: 8 mg/dL (ref 6–20)
CO2: 26 mmol/L (ref 22–32)
Calcium: 9 mg/dL (ref 8.9–10.3)
Chloride: 103 mmol/L (ref 101–111)
Creatinine, Ser: 1.06 mg/dL (ref 0.61–1.24)
GFR calc Af Amer: >60 mL/min (ref >60)
GFR calc non Af Amer: >60 mL/min (ref >60)
GLUCOSE: 123 mg/dL — AB (ref 65–99)
POTASSIUM: 3.7 mmol/L (ref 3.5–5.1)
Sodium: 137 mmol/L (ref 135–145)

## 2016-02-09 LAB — POC OCCULT BLOOD, ED: Fecal Occult Bld: NEGATIVE

## 2016-02-09 MED ORDER — GI COCKTAIL ~~LOC~~
30.0000 mL | Freq: Once | ORAL | Status: AC
  Administered 2016-02-09: 30 mL via ORAL
  Filled 2016-02-09: qty 30

## 2016-02-09 MED ORDER — PANTOPRAZOLE SODIUM 40 MG PO TBEC: 40.0000 mg | DELAYED_RELEASE_TABLET | Freq: Every day | ORAL | 0 refills | Status: AC

## 2016-02-09 NOTE — ED Notes (Signed)
EDP at the bedside.  ?

## 2016-02-09 NOTE — ED Triage Notes (Signed)
PT reports Lt sided CP with out radiation. Pt reports increased CP when turning over in bed. Pt also feels weak

## 2016-02-09 NOTE — ED Provider Notes (Signed)
[facility]-EMERGENCY DEPT Provider Note   CSN: [id number] Arrival date & time: 02/09/16  1624     History   Chief Complaint Chief Complaint  Patient presents with  . Chest Pain    HPI Christopher Mcintyre is a 41 y.o. male.  Patient with a history of HTN, GERD, medication noncompliance presents with complaint of chest pain. He reports chronic symptoms of pain across chest, but 3 days of pain in the left chest described as sharp, constant and worse when supine or sitting up in certain positions. No nausea, vomiting, diaphoresis. He feels generally weak and fatigued today. No fever but he reports cough for the past several days causing him to feel he was "getting the bug". He states he had been on Protonix previously, as well as medications for HTN, but has not taken any medications for over 2 years secondary to financial constraints.    The history is provided by the patient and the spouse. No language interpreter was used.    Past Medical History:  Diagnosis Date  . Allergy    seasonal allergies-tx. Flonase Spray  . Anxiety   . Arthritis    DDD-lumbar, tx. neurontin,bilateral tingling ,? rt. foot drop,heaviness of left leg  . Depression   . GERD (gastroesophageal reflux disease)   . H/O sickle cell trait 08-21-12   son age 51 has sickle cell disease, 2 daughters-has trait.  Marland Kitchen Headache(784.0)    migraines-last 3 weeks ago.  Marland Kitchen Hypertension   . Reflux   . Shortness of breath    on exertion  . Sleep apnea    "was told this", no study done    Patient Active Problem List   Diagnosis Date Noted  . HTN (hypertension), benign 08/05/2013  . Unspecified sleep apnea 08/05/2013  . Hemoptysis 05/21/2013  . Pulmonary infiltrate 05/21/2013  . Lumbosacral disc herniation 07/30/2012  . HTN (hypertension) 06/28/2012  . Back pain 06/28/2012  . Depression 06/28/2012  . SUBSTANCE ABUSE, MULTIPLE 10/14/2009  . GERD 10/14/2009  . CHEST PAIN, ATYPICAL 10/14/2009  . HYPOKALEMIA, HX OF 10/14/2009      Past Surgical History:  Procedure Laterality Date  . COLONOSCOPY WITH PROPOFOL N/A 08/29/2012   Procedure: COLONOSCOPY WITH PROPOFOL;  Surgeon: Willis Modena, MD;  Location: [facility];  Service: Endoscopy;  Laterality: N/A;  . ESOPHAGOGASTRODUODENOSCOPY (EGD) WITH PROPOFOL N/A 08/29/2012   Procedure: ESOPHAGOGASTRODUODENOSCOPY (EGD) WITH PROPOFOL;  Surgeon: Willis Modena, MD;  Location: [facility];  Service: Endoscopy;  Laterality: N/A;       Home Medications    Prior to Admission medications   Medication Sig Start Date End Date Taking? Authorizing Provider  acetaminophen-codeine (TYLENOL #3) 300-30 MG per tablet Take 1 tablet by mouth every 4 (four) hours as needed. 08/05/13   Quentin Angst, MD  acyclovir (ZOVIRAX) 400 MG tablet Take 1 tablet (400 mg total) by mouth 5 (five) times daily. 04/04/13   Quentin Angst, MD  ferrous sulfate 325 (65 FE) MG tablet Take 325 mg by mouth daily as needed (for iron).    Historical Provider, MD  fluticasone (FLONASE) 50 MCG/ACT nasal spray Place 2 sprays into the nose daily. 09/27/12   Dorothea Ogle, MD  gabapentin (NEURONTIN) 300 MG capsule Take 1 capsule (300 mg total) by mouth 3 (three) times daily. 04/04/13   Quentin Angst, MD  hydrochlorothiazide (HYDRODIURIL) 25 MG tablet Take 1 tablet (25 mg total) by mouth daily. 04/04/13   Quentin Angst, MD  isosorbide mononitrate (IMDUR) 30  MG 24 hr tablet Take 1 tablet (30 mg total) by mouth daily. 04/24/13   Quentin Angstlugbemiga E Jegede, MD  lisinopril (PRINIVIL,ZESTRIL) 40 MG tablet Take 1 tablet (40 mg total) by mouth daily. 04/04/13   Quentin Angstlugbemiga E Jegede, MD  pantoprazole (PROTONIX) 40 MG tablet Take 1 tablet (40 mg total) by mouth daily. 08/05/13   Quentin Angstlugbemiga E Jegede, MD  polyvinyl alcohol (LIQUIFILM TEARS) 1.4 % ophthalmic solution Place 1 drop into both eyes daily as needed (for dry eyes).    Historical Provider, MD  QUEtiapine (SEROQUEL) 100 MG tablet Take 100 mg by mouth at bedtime.     Historical Provider, MD  ranitidine (ZANTAC) 150 MG tablet Take 1 tablet (150 mg total) by mouth 2 (two) times daily. 08/05/13   Quentin Angstlugbemiga E Jegede, MD  sertraline (ZOLOFT) 100 MG tablet Take 1 tablet (100 mg total) by mouth daily at 12 noon. 04/04/13   Quentin Angstlugbemiga E Jegede, MD  traZODone (DESYREL) 100 MG tablet Take 1 tablet (100 mg total) by mouth at bedtime as needed for sleep. 05/16/13   Quentin Angstlugbemiga E Jegede, MD  Vitamin D, Ergocalciferol, (DRISDOL) 50000 UNITS CAPS capsule Take 1 capsule (50,000 Units total) by mouth every 7 (seven) days. 08/05/13   Quentin Angstlugbemiga E Jegede, MD    Family History Family History  Problem Relation Age of Onset  . Hyperlipidemia Mother   . Aneurysm Mother   . Hypertension Mother   . Asthma Mother   . Pancreatic cancer Father   . Diabetes Maternal Aunt   . Diabetes Maternal Uncle   . Diabetes Brother   . Asthma Brother   . Allergies Other     multiple members of family  . Clotting disorder Other     mutiple members in family    Social History Social History  Substance Use Topics  . Smoking status: Light Tobacco Smoker    Packs/day: 0.25    Years: 22.00    Types: Cigarettes  . Smokeless tobacco: Never Used     Comment: Smokes 3 cigs daily at most.  . Alcohol use No     Allergies   Amlodipine   Review of Systems Review of Systems  Constitutional: Positive for fatigue. Negative for chills and fever.  HENT: Negative.   Respiratory: Positive for cough. Negative for shortness of breath.   Cardiovascular: Positive for chest pain.  Gastrointestinal: Negative.  Negative for abdominal pain, nausea and vomiting.  Musculoskeletal: Negative.  Negative for myalgias.  Neurological: Positive for weakness.     Physical Exam Updated Vital Signs BP 161/99 (BP Location: Left Arm)   Pulse 78   Temp 98.9 F (37.2 C) (Oral)   Resp 19   Ht 5\' 11"  (1.803 m)   Wt 79.6 kg   SpO2 99%   BMI 24.49 kg/m   Physical Exam  Constitutional: He is oriented to  person, place, and time. He appears well-developed and well-nourished.  HENT:  Head: Normocephalic.  Neck: Normal range of motion. Neck supple.  Cardiovascular: Normal rate and regular rhythm.   No murmur heard. Pulmonary/Chest: Effort normal and breath sounds normal. He has no wheezes. He has no rales. He exhibits no tenderness.  Abdominal: Soft. Bowel sounds are normal. There is no tenderness. There is no rebound and no guarding.  Musculoskeletal: Normal range of motion. He exhibits no edema.  Neurological: He is alert and oriented to person, place, and time.  Skin: Skin is warm and dry. No rash noted.  Psychiatric: He has a normal mood  and affect.     ED Treatments / Results  Labs (all labs ordered are listed, but only abnormal results are displayed) Labs Reviewed  BASIC METABOLIC PANEL - Abnormal; Notable for the following:       Result Value   Glucose, Bld 123 (*)    All other components within normal limits  CBC - Abnormal; Notable for the following:    Hemoglobin 8.4 (*)    HCT 29.4 (*)    MCV 59.6 (*)    MCH 17.0 (*)    MCHC 28.6 (*)    RDW 21.3 (*)    All other components within normal limits  I-STAT TROPOININ, ED  POC OCCULT BLOOD, ED    EKG  EKG Interpretation None       Radiology Dg Chest 2 View  Result Date: 02/09/2016 CLINICAL DATA:  Intermittent chest pain for 4-5 years, worsening today. Shortness of breath and weakness. History of hypertension, out of medication for 2 years. EXAM: CHEST  2 VIEW COMPARISON:  CT chest May 03, 2013 FINDINGS: Cardiomediastinal silhouette is normal. No pleural effusions or focal consolidations. Increased lung volumes with flattened hemidiaphragms. Trachea projects midline and there is no pneumothorax. Soft tissue planes and included osseous structures are non-suspicious. IMPRESSION: Hyperinflation without focal consolidation. Electronically Signed   By: Awilda Metro M.D.   On: 02/09/2016 18:09    Procedures Procedures  (including critical care time)  Medications Ordered in ED Medications  gi cocktail (Maalox,Lidocaine,Donnatal) (not administered)     Initial Impression / Assessment and Plan / ED Course  I have reviewed the triage vital signs and the nursing notes.  Pertinent labs & imaging results that were available during my care of the patient were reviewed by me and considered in my medical decision making (see chart for details).  Clinical Course     Patient presents with left chest pain x 3 days, and chronic bilateral chest pain that is ongoing. He is not under the care of a physician so has not been on medications for blood pressure and GERD. Cough without fever. Minimal smoker.  Labs reassuring, including Delta troponin. EKG unchanged from previous, CXR clear of abnormalities. GI cocktail improves pain. New pain in left chest is felt to be related to uncontrolled GERD, no ACS. He will be restarted on Protonix. Will refer to [facility] where he is a former patient for primary care concerns, including blood pressure.  Final Clinical Impressions(s) / ED Diagnoses   Final diagnoses:  None  1. GERD  New Prescriptions New Prescriptions   No medications on file     Elpidio Anis, Cordelia Poche 02/09/16 2340    Arby Barrette, MD 02/11/16 1238

## 2016-02-10 NOTE — ED Notes (Signed)
Pt departed in NAD, refused use of wheelchair.  

## 2019-08-15 ENCOUNTER — Ambulatory Visit: Payer: Self-pay | Admitting: Adult Health

## 2019-09-10 ENCOUNTER — Ambulatory Visit: Payer: Self-pay | Admitting: Adult Health

## 2019-09-10 DIAGNOSIS — Z0289 Encounter for other administrative examinations: Secondary | ICD-10-CM

## 2021-05-06 ENCOUNTER — Ambulatory Visit
Admission: EM | Admit: 2021-05-06 | Discharge: 2021-05-06 | Disposition: A | Discharge status: Other Acute Inpatient Hospital | Payer: BC Managed Care – PPO

## 2021-05-06 ENCOUNTER — Emergency Department (HOSPITAL_COMMUNITY): Payer: Self-pay

## 2021-05-06 ENCOUNTER — Encounter (HOSPITAL_COMMUNITY): Payer: Self-pay

## 2021-05-06 ENCOUNTER — Other Ambulatory Visit: Payer: Self-pay

## 2021-05-06 ENCOUNTER — Emergency Department (HOSPITAL_COMMUNITY)
Admission: EM | Admit: 2021-05-06 | Discharge: 2021-05-06 | Disposition: A | Discharge status: Home / Self Care | Payer: Self-pay | Attending: Emergency Medicine | Admitting: Emergency Medicine

## 2021-05-06 DIAGNOSIS — Z23 Encounter for immunization: Secondary | ICD-10-CM | POA: Insufficient documentation

## 2021-05-06 DIAGNOSIS — Y92009 Unspecified place in unspecified non-institutional (private) residence as the place of occurrence of the external cause: Secondary | ICD-10-CM | POA: Insufficient documentation

## 2021-05-06 DIAGNOSIS — W25XXXA Contact with sharp glass, initial encounter: Secondary | ICD-10-CM | POA: Insufficient documentation

## 2021-05-06 DIAGNOSIS — S61217A Laceration without foreign body of left little finger without damage to nail, initial encounter: Secondary | ICD-10-CM | POA: Insufficient documentation

## 2021-05-06 LAB — RAPID HIV SCREEN (HIV 1/2 AB+AG)
HIV 1/2 Antibodies: NONREACTIVE
HIV-1 P24 Antigen - HIV24: NONREACTIVE

## 2021-05-06 LAB — HEPATITIS B SURFACE ANTIGEN: Hepatitis B Surface Ag: NONREACTIVE

## 2021-05-06 MED ORDER — LIDOCAINE HCL 1 % IJ SOLN
INTRAMUSCULAR | Status: AC
  Filled 2021-05-06: qty 20

## 2021-05-06 MED ORDER — LIDOCAINE HCL 1 % IJ SOLN: 10.0000 mL | Freq: Once | INTRAMUSCULAR | Status: DC

## 2021-05-06 MED ORDER — TETANUS-DIPHTH-ACELL PERTUSSIS 5-2.5-18.5 LF-MCG/0.5 IM SUSY
0.5000 mL | PREFILLED_SYRINGE | Freq: Once | INTRAMUSCULAR | Status: AC
Start: 2021-05-06 — End: 2021-05-06
  Administered 2021-05-06: 0.5 mL via INTRAMUSCULAR
  Filled 2021-05-06: qty 0.5

## 2021-05-06 MED ORDER — HYDROCODONE-ACETAMINOPHEN 5-325 MG PO TABS
1.0000 | ORAL_TABLET | Freq: Once | ORAL | Status: AC
  Administered 2021-05-06: 1 via ORAL
  Filled 2021-05-06: qty 1

## 2021-05-06 NOTE — ED Triage Notes (Addendum)
[facility] BIB [facility] from [facility]. Pt cut his left pinky yesterday at work. [facility] called [facility] to bring pt to ED. Pt has hx of HTN, states he quit taking his prescribed BP meds.  ?

## 2021-05-06 NOTE — ED Notes (Addendum)
Pt walked outside to "lock car" despite nurse request to wait for ems  ? ?

## 2021-05-06 NOTE — ED Notes (Signed)
Pt returned to room  

## 2021-05-06 NOTE — ED Provider Notes (Signed)
?[facility]-EMERGENCY DEPT ?Provider Note ? ? ?CSN: [id number] ?Arrival date & time: 05/06/21  1818 ? ?  ? ?History ? ?Chief Complaint  ?Patient presents with  ? Laceration  ? ? ?Christopher Mcintyre is a 46 y.o. male. ? ?46 y.o male with no PMH presents to the ED with a chief complaint of left hand laceration x yesterday.  Patient reports he was doing some remote elation at home, began to change windows when he suddenly cut his left finger with some glass.  He reports continuous bleeding.  He did clean this along with rapid with a tight bandage, he had to change the bandage overnight.  Today he was evaluated urgent care as wound did not stop bleeding.  He was sent to the ER via EMS due to ongoing bleeding of his left little finger.  Currently not on any anticoagulations, denies any prior history of alcohol abuse, no other complaints. ? ?The history is provided by the patient and medical records.  ?Laceration ?Associated symptoms: no fever   ? ?  ? ?Home Medications ?Prior to Admission medications   ?Medication Sig Start Date End Date Taking? Authorizing Provider  ?acetaminophen-codeine (TYLENOL #3) 300-30 MG per tablet Take 1 tablet by mouth every 4 (four) hours as needed. 08/05/13   Quentin AngstJegede, Olugbemiga E, MD  ?acyclovir (ZOVIRAX) 400 MG tablet Take 1 tablet (400 mg total) by mouth 5 (five) times daily. 04/04/13   Quentin AngstJegede, Olugbemiga E, MD  ?ferrous sulfate 325 (65 FE) MG tablet Take 325 mg by mouth daily as needed (for iron).    [provider]  ?fluticasone (FLONASE) 50 MCG/ACT nasal spray Place 2 sprays into the nose daily. 09/27/12   Dorothea OgleMyers, Iskra M, MD  ?gabapentin (NEURONTIN) 300 MG capsule Take 1 capsule (300 mg total) by mouth 3 (three) times daily. 04/04/13   Quentin AngstJegede, Olugbemiga E, MD  ?hydrochlorothiazide (HYDRODIURIL) 25 MG tablet Take 1 tablet (25 mg total) by mouth daily. 04/04/13   Quentin AngstJegede, Olugbemiga E, MD  ?isosorbide mononitrate (IMDUR) 30 MG 24 hr tablet Take 1 tablet (30 mg total) by  mouth daily. 04/24/13   Quentin AngstJegede, Olugbemiga E, MD  ?lisinopril (PRINIVIL,ZESTRIL) 40 MG tablet Take 1 tablet (40 mg total) by mouth daily. 04/04/13   Quentin AngstJegede, Olugbemiga E, MD  ?pantoprazole (PROTONIX) 40 MG tablet Take 1 tablet (40 mg total) by mouth daily. 02/09/16   Elpidio AnisUpstill, Shari, PA-C  ?polyvinyl alcohol (LIQUIFILM TEARS) 1.4 % ophthalmic solution Place 1 drop into both eyes daily as needed (for dry eyes).    [provider]  ?QUEtiapine (SEROQUEL) 100 MG tablet Take 100 mg by mouth at bedtime.    [provider]  ?ranitidine (ZANTAC) 150 MG tablet Take 1 tablet (150 mg total) by mouth 2 (two) times daily. 08/05/13   Quentin AngstJegede, Olugbemiga E, MD  ?sertraline (ZOLOFT) 100 MG tablet Take 1 tablet (100 mg total) by mouth daily at 12 noon. 04/04/13   Quentin AngstJegede, Olugbemiga E, MD  ?traZODone (DESYREL) 100 MG tablet Take 1 tablet (100 mg total) by mouth at bedtime as needed for sleep. 05/16/13   Quentin AngstJegede, Olugbemiga E, MD  ?Vitamin D, Ergocalciferol, (DRISDOL) 50000 UNITS CAPS capsule Take 1 capsule (50,000 Units total) by mouth every 7 (seven) days. 08/05/13   Quentin AngstJegede, Olugbemiga E, MD  ?   ? ?Allergies    ?Amlodipine   ? ?Review of Systems   ?Review of Systems  ?Constitutional:  Negative for fever.  ?Skin:  Positive for wound.  ? ?Physical  Exam ?Updated Vital Signs ?BP (!) 158/98   Pulse 99   Temp 98.3 ?F (36.8 ?C) (Oral)   Resp 18   SpO2 99%  ?Physical Exam ?Vitals and nursing note reviewed.  ?Constitutional:   ?   Appearance: Normal appearance.  ?Eyes:  ?   Pupils: Pupils are equal, round, and reactive to light.  ?Cardiovascular:  ?   Rate and Rhythm: Normal rate.  ?Pulmonary:  ?   Effort: Pulmonary effort is normal.  ?Abdominal:  ?   General: Abdomen is flat.  ?Musculoskeletal:  ?   Left hand: Laceration and tenderness present. No swelling, deformity or bony tenderness. Normal range of motion. Normal strength. Normal sensation. There is no disruption of two-point discrimination. Normal capillary refill. Normal  pulse.  ?   Cervical back: Normal range of motion and neck supple.  ?   Comments: Left little finger appears somewhat dusky, somewhat macerated from ongoing bandage.  After removal, color has returned.  Complete flexion and extension of the left little finger.  Sensation is intact throughout. Pulses present, capillary refill present. Sensation intact throughout.   ?Skin: ?   General: Skin is warm and dry.  ?Neurological:  ?   Mental Status: He is alert and oriented to person, place, and time.  ? ? ?ED Results / Procedures / Treatments   ?Labs ?(all labs ordered are listed, but only abnormal results are displayed) ?Labs Reviewed  ?RAPID HIV SCREEN (HIV 1/2 AB+AG)  ?HEPATITIS B SURFACE ANTIGEN  ? ? ?EKG ?None ? ?Radiology ?DG Finger Little Left ? ?Result Date: 05/06/2021 ?CLINICAL DATA:  Laceration. EXAM: LEFT LITTLE FINGER 2+V COMPARISON:  None. FINDINGS: Overlying bandage present. There is no evidence of fracture or dislocation. There is no evidence of arthropathy or other focal bone abnormality. Soft tissues are unremarkable. IMPRESSION: Negative. Electronically Signed   By: Darliss Cheney M.D.   On: 05/06/2021 19:45   ? ?Procedures ?Marland Kitchen.Laceration Repair ? ?Date/Time: 05/06/2021 8:09 PM ?Performed by: Claude Manges, PA-C ?Authorized by: Claude Manges, PA-C  ? ?Consent:  ?  Consent obtained:  Verbal ?  Consent given by:  Patient ?  Risks discussed:  Infection, pain and poor cosmetic result ?Universal protocol:  ?  Patient identity confirmed:  Verbally with patient ?Anesthesia:  ?  Anesthesia method:  Local infiltration ?  Local anesthetic:  Lidocaine 1% w/o epi ?Laceration details:  ?  Location:  Finger ?  Finger location:  L small finger ?  Length (cm):  1 ?  Depth (mm):  1 ?Exploration:  ?  Hemostasis achieved with:  Direct pressure and tourniquet ?  Imaging outcome: foreign body not noted   ?Treatment:  ?  Area cleansed with:  Saline ?  Amount of cleaning:  Extensive ?Skin repair:  ?  Repair method:  Sutures ?  Suture  size:  4-0 ?  Suture material:  Prolene ?  Suture technique:  Simple interrupted and figure eight ?  Number of sutures:  4 ?Approximation:  ?  Approximation:  Close ?Repair type:  ?  Repair type:  Simple ?Post-procedure details:  ?  Dressing:  Splint for protection ?  Procedure completion:  Tolerated well, no immediate complications ?Comments:  ?   Patient's wound had an arterial component, part of the blood did strike me in the right eye, I irrigated my eye thoroughly after this. He was asked to provide a blood sample in order to obtain rapid testing.   ? ? ?Medications Ordered in ED ?Medications  ?lidocaine (  XYLOCAINE) 1 % (with pres) injection 10 mL (0 mLs Intradermal Hold 05/06/21 1855)  ?HYDROcodone-acetaminophen (NORCO/VICODIN) 5-325 MG per tablet 1 tablet (1 tablet Oral Given 05/06/21 1852)  ?lidocaine (XYLOCAINE) 1 % (with pres) injection (  Given by Other 05/06/21 1853)  ?Tdap (BOOSTRIX) injection 0.5 mL (0.5 mLs Intramuscular Given 05/06/21 1940)  ? ? ?ED Course/ Medical Decision Making/ A&P ?  ?                        ?Medical Decision Making ?Amount and/or Complexity of Data Reviewed ?Labs: ordered. ?Radiology: ordered. ? ?Risk ?Prescription drug management. ? ? ?Patient presents to the ED status post left little finger laceration since yesterday.  Patient was evaluated urgent care today and was sent via EMS for further repair as laceration was too deep. ? ?On evaluation patient's finger appears somewhat dusky and macerated, although he has had a dressing since yesterday, he reports changing this throughout the night.  Does have sensation intact, full flexion and extension of the left little finger.  small laceration noted approximately 1 cm with some deep involvement along with a slight arterial vessel involved.  On evaluation of the wound, a small amount of blood did post my right eye.  ? ?After significant irrigation with saline, I was able to repair his left little finger with 4 stitches, one of them being a  running stitch with 4-0 Prolene.  He tolerated the procedure well.  Patient was asked to give a small sample of blood in order to have his blood further evaluated with rapid testing for HIV, hepatitis. ? ?Patient

## 2021-05-06 NOTE — ED Notes (Signed)
Ems called again to check on eta.  Pt notified  ? ?

## 2021-05-06 NOTE — ED Notes (Signed)
Pt arrived with co of laceration that is actively bleeding. Pt pulled back to assess and stop bleeding. Unwrapped patients hand and wound pulsating with active bleeding. Re wrapped wound and applied pressure. Pt st the accident happened yesterday. PA notified and PA instructed me to inform patient he needs to go to the hospital. Pt st he drove here and offered to call ems for patient  ? ?

## 2021-05-06 NOTE — ED Notes (Signed)
ED Provider at bedside. 

## 2021-05-06 NOTE — ED Notes (Signed)
Pt walked to restroom 

## 2021-05-06 NOTE — Discharge Instructions (Addendum)
We placed approximately 4 stitches to your left little finger, these will need to be removed in the next 7 to 10 days. ? ?You may apply Neosporin to the area in the next 48 hours.  You will need to keep your left finger in a splint until follow-up with hand specialist. ? ?The phone number to Dr. Izora Ribas, hand specialist for further evaluation. ?

## 2022-06-06 ENCOUNTER — Institutional Professional Consult (permissible substitution): Payer: Medicaid Other | Admitting: Pulmonary Disease

## 2022-06-22 ENCOUNTER — Institutional Professional Consult (permissible substitution): Payer: Medicaid Other | Admitting: Pulmonary Disease

## 2022-06-23 ENCOUNTER — Encounter: Payer: Self-pay | Admitting: Pulmonary Disease

## 2022-06-29 NOTE — Progress Notes (Unsigned)
@Patient  ID: Christopher Mcintyre, male    DOB: Jun 04, 1975, 47 y.o.   MRN: [id number]  No chief complaint on file.   Referring provider: Filomena Jungling, NP  HPI: 47 year old male, light smoker.  Past medical history significant for hypertension, sleep apnea, hemoptysis, pulmonary infiltrate, hiatal hernia, GERD, depression, atypical chest pain, substance abuse.  06/30/2022 Patient presents today for a sleep consult.  Referred by family medicine due to loud snoring and sleep disturbance. He has reported stress at home, takes xanax TID as needed for anxiety.   Following with cardiology for dyspnea.  Patient had calcium score 61.3, 93rd percentile for age and race.  Low to normal left ejection fraction EF 53%.  Single-vessel nonobstructive coronary artery disease.    Allergies  Allergen Reactions   Amlodipine Rash    Immunization History  Administered Date(s) Administered   Tdap 05/06/2021    Past Medical History:  Diagnosis Date   Allergy    seasonal allergies-tx. Flonase Spray   Anxiety    Arthritis    DDD-lumbar, tx. neurontin,bilateral tingling ,? rt. foot drop,heaviness of left leg   Depression    GERD (gastroesophageal reflux disease)    H/O sickle cell trait 08-21-12   son age 4 has sickle cell disease, 2 daughters-has trait.   Headache(784.0)    migraines-last 3 weeks ago.   Hypertension    Reflux    Shortness of breath    on exertion   Sleep apnea    "was told this", no study done    Tobacco History: Social History   Tobacco Use  Smoking Status Light Smoker   Packs/day: 0.25   Years: 22.00   Additional pack years: 0.00   Total pack years: 5.50   Types: Cigarettes  Smokeless Tobacco Never  Tobacco Comments   Smokes 3 cigs daily at most.   Ready to quit: Not Answered Counseling given: Not Answered Tobacco comments: Smokes 3 cigs daily at most.   Outpatient Medications Prior to Visit  Medication Sig Dispense Refill   acetaminophen-codeine (TYLENOL  #3) 300-30 MG per tablet Take 1 tablet by mouth every 4 (four) hours as needed. 60 tablet 0   acyclovir (ZOVIRAX) 400 MG tablet Take 1 tablet (400 mg total) by mouth 5 (five) times daily. 35 tablet 0   ferrous sulfate 325 (65 FE) MG tablet Take 325 mg by mouth daily as needed (for iron).     fluticasone (FLONASE) 50 MCG/ACT nasal spray Place 2 sprays into the nose daily. 16 g 11   gabapentin (NEURONTIN) 300 MG capsule Take 1 capsule (300 mg total) by mouth 3 (three) times daily. 90 capsule 5   hydrochlorothiazide (HYDRODIURIL) 25 MG tablet Take 1 tablet (25 mg total) by mouth daily. 30 tablet 3   isosorbide mononitrate (IMDUR) 30 MG 24 hr tablet Take 1 tablet (30 mg total) by mouth daily. 30 tablet 3   lisinopril (PRINIVIL,ZESTRIL) 40 MG tablet Take 1 tablet (40 mg total) by mouth daily. 30 tablet 3   pantoprazole (PROTONIX) 40 MG tablet Take 1 tablet (40 mg total) by mouth daily. 30 tablet 0   polyvinyl alcohol (LIQUIFILM TEARS) 1.4 % ophthalmic solution Place 1 drop into both eyes daily as needed (for dry eyes).     QUEtiapine (SEROQUEL) 100 MG tablet Take 100 mg by mouth at bedtime.     ranitidine (ZANTAC) 150 MG tablet Take 1 tablet (150 mg total) by mouth 2 (two) times daily. 60 tablet 3   sertraline (ZOLOFT) 100  MG tablet Take 1 tablet (100 mg total) by mouth daily at 12 noon. 30 tablet 5   traZODone (DESYREL) 100 MG tablet Take 1 tablet (100 mg total) by mouth at bedtime as needed for sleep. 90 tablet 3   Vitamin D, Ergocalciferol, (DRISDOL) 50000 UNITS CAPS capsule Take 1 capsule (50,000 Units total) by mouth every 7 (seven) days. 10 capsule 0   No facility-administered medications prior to visit.      Review of Systems  Review of Systems   Physical Exam  There were no vitals taken for this visit. Physical Exam   Lab Results:  CBC    Component Value Date/Time   WBC 7.6 02/09/2016 1637   RBC 4.93 02/09/2016 1637   HGB 8.4 (L) 02/09/2016 1637   HCT 29.4 (L) 02/09/2016  1637   PLT 150 02/09/2016 1637   MCV 59.6 (L) 02/09/2016 1637   MCH 17.0 (L) 02/09/2016 1637   MCHC 28.6 (L) 02/09/2016 1637   RDW 21.3 (H) 02/09/2016 1637   LYMPHSABS 1.2 08/05/2013 1530   MONOABS 0.5 08/05/2013 1530   EOSABS 0.1 08/05/2013 1530   BASOSABS 0.0 08/05/2013 1530    BMET    Component Value Date/Time   NA 137 02/09/2016 1637   K 3.7 02/09/2016 1637   CL 103 02/09/2016 1637   CO2 26 02/09/2016 1637   GLUCOSE 123 (H) 02/09/2016 1637   BUN 8 02/09/2016 1637   CREATININE 1.06 02/09/2016 1637   CREATININE 0.98 04/24/2013 1136   CALCIUM 9.0 02/09/2016 1637   GFRNONAA >60 02/09/2016 1637   GFRNONAA 84 01/28/2013 1106   GFRAA >60 02/09/2016 1637   GFRAA >89 01/28/2013 1106    BNP No results found for: "BNP"  ProBNP No results found for: "PROBNP"  Imaging: No results found.   Assessment & Plan:   No problem-specific Assessment & Plan notes found for this encounter.     Glenford Bayley, NP 06/29/2022

## 2022-06-29 NOTE — Patient Instructions (Signed)

## 2022-06-30 ENCOUNTER — Ambulatory Visit: Payer: No Typology Code available for payment source | Admitting: Primary Care

## 2022-06-30 ENCOUNTER — Encounter: Payer: Self-pay | Admitting: Primary Care

## 2022-06-30 VITALS — BP 150/100 | HR 75 | Temp 97.4°F | Ht 71.0 in | Wt 184.4 lb

## 2022-06-30 DIAGNOSIS — I159 Secondary hypertension, unspecified: Secondary | ICD-10-CM | POA: Diagnosis not present

## 2022-06-30 DIAGNOSIS — R0683 Snoring: Secondary | ICD-10-CM | POA: Diagnosis not present

## 2022-06-30 NOTE — Assessment & Plan Note (Addendum)
-   Hx moderate sleep apnea, never started on CPAP d/t cost.  Patient has symptoms of loud snoring, nocturnal chest pain and daytime sleepiness. Epworth score 20.  Concern patient could have underlying obstructive sleep apnea, needs home sleep study evaluate.  We reviewed risk of untreated sleep apnea including cardiac arrhythmias, pulmonary hypertension, diabetes and stroke.  We also discussed treatment options including weight loss, oral appliance, CPAP therapy or referral to ENT for possible surgical options.  Patient would be open to trying CPAP therapy if needed but cost may be an issue due to [employer].  Encourage side sleeping position.  Advised against driving if experiencing excessive daytime sleepiness or fatigue.  Caution alcohol use or sedatives prior to bedtime as these can worsen underlying sleep apnea.  Follow-up to 2 weeks after sleep study review results of treatment options if needed.

## 2022-06-30 NOTE — Progress Notes (Signed)
Reviewed and agree with assessment/plan.   Coralyn Helling, MD [facility] Pulmonary/Critical Care 06/30/2022, 12:22 PM Pager:  [phone]

## 2022-06-30 NOTE — Assessment & Plan Note (Addendum)
-   BP elevated 150/100, currently asymptomatic - Following with cardiology closely  - Continue Coreg and lisinopril-HCTZ

## 2022-07-25 ENCOUNTER — Ambulatory Visit: Payer: No Typology Code available for payment source | Admitting: Primary Care

## 2022-07-25 DIAGNOSIS — G4733 Obstructive sleep apnea (adult) (pediatric): Secondary | ICD-10-CM | POA: Diagnosis not present

## 2022-07-25 DIAGNOSIS — R0683 Snoring: Secondary | ICD-10-CM

## 2022-07-29 DIAGNOSIS — G4733 Obstructive sleep apnea (adult) (pediatric): Secondary | ICD-10-CM

## 2022-08-02 NOTE — Progress Notes (Signed)
Please let patient know HST showed moderate OSA with moderate-severe oxygen desaturation, average 17 events an hour with SpO2 low 70% (average 92%). Needs virtual visit to review results and CPAP therapy.

## 2022-09-02 ENCOUNTER — Ambulatory Visit: Payer: Medicaid Other | Admitting: Primary Care

## 2022-10-06 ENCOUNTER — Ambulatory Visit (INDEPENDENT_AMBULATORY_CARE_PROVIDER_SITE_OTHER): Payer: Medicaid Other | Admitting: Primary Care

## 2022-10-06 ENCOUNTER — Encounter: Payer: Self-pay | Admitting: Primary Care

## 2022-10-06 VITALS — BP 144/84 | HR 79 | Temp 98.0°F | Ht 71.0 in | Wt 190.6 lb

## 2022-10-06 DIAGNOSIS — G4733 Obstructive sleep apnea (adult) (pediatric): Secondary | ICD-10-CM | POA: Diagnosis not present

## 2022-10-06 NOTE — Progress Notes (Signed)
Reviewed and agree with assessment/plan.   Coralyn Helling, MD [facility] Pulmonary/Critical Care 10/06/2022, 1:07 PM Pager:  [phone]

## 2022-10-06 NOTE — Progress Notes (Signed)
@Patient  ID: Christopher Mcintyre, male    DOB: [birth year], 47 y.o.   MRN: [id number]  Chief Complaint  Patient presents with   Follow-up    F/u HST    Referring provider: No ref. provider found  HPI: 47 year old male, light smoker.  Past medical history significant for hypertension, sleep apnea, hemoptysis, pulmonary infiltrate, hiatal hernia, GERD, depression, atypical chest pain, substance abuse.  Previous [facility] pulmonary encounter: 06/30/2022 Patient presents today for a sleep consult.  Referred by family medicine due to loud snoring and sleep disturbance. He had sleep study in 2015 which showed moderate OSA, he was not able to afford CPAP at that time. He continues to have symptoms of loud snoring and daytime sleepiness. Not sleeping well at night, very fragmented. If he does get sleep it's only for short period and he wakes up with chest pain. He mainly sleeps on his back or left side. He reports stress at home, takes xanax TID as needed for anxiety. Epworth score 20. No concern for narcolepsy, cataplexy or sleep walking.  Following with cardiology for dyspnea.  Patient had calcium score 61.3, 93rd percentile for age and race. Low to normal left ejection fraction EF 53%. Single-vessel nonobstructive coronary artery disease.  Sleep questionnaire Symptoms-history of severe sleep apnea, snoring symptoms and daytime sleepiness Prior sleep study- 05/23/13 >> AHI 19/hour  Bedtime-10 PM Time to fall asleep-patient has a hard time falling asleep Nocturnal awakenings- several times Out of bed/start of day-8 AM Weight changes-stable Do you operate heavy machinery-no Do you currently wear CPAP-no Do you current wear oxygen-no Epworth- 20  Social history Patient is single, widowed.  He has children.  Works in [employer].  Quit drinking and smoking.   10/06/2022- Interim hx  Patient presents today to review sleep study results. He was seen in May for sleep consult. He has hx sleep apnea, never  started on CPAP. He reports symptoms of snoring and daytime sleepiness. He is very tired. Not getting good sleep. He has no trouble falllings asleep but has trouble staying asleep. Repeat Home sleep study on 07/26/2022 showed moderate obstructive sleep apnea, AHI 17.8/hour with SpO2 low 70% (baseline 92%). Reviewed sleep study results and treatment options. Recommending patient be started on auto CPAP due to moderate severity of his sleep apnea, nocturnal hypoxemia and excessive daytime sleepiness.     Allergies  Allergen Reactions   Amlodipine Rash    Immunization History  Administered Date(s) Administered   Tdap 05/06/2021    Past Medical History:  Diagnosis Date   Allergy    seasonal allergies-tx. Flonase Spray   Anxiety    Arthritis    DDD-lumbar, tx. neurontin,bilateral tingling ,? rt. foot drop,heaviness of left leg   Depression    GERD (gastroesophageal reflux disease)    H/O sickle cell trait 08/21/2012   son age 23 has sickle cell disease, 2 daughters-has trait.   Headache(784.0)    migraines-last 3 weeks ago.   Hyperlipidemia    Hypertension    Reflux    Shortness of breath    on exertion   Sleep apnea    "was told this", no study done    Tobacco History: Social History   Tobacco Use  Smoking Status Former   Current packs/day: 0.00   Average packs/day: 0.3 packs/day for 22.0 years (5.5 ttl pk-yrs)   Types: Cigarettes, E-cigarettes   Start date: 94   Quit date: 2017   Years since quitting: 7.6  Smokeless Tobacco Former  Tobacco Comments  Smokes 3 cigs daily at most.   Counseling given: Not Answered Tobacco comments: Smokes 3 cigs daily at most.   Outpatient Medications Prior to Visit  Medication Sig Dispense Refill   acetaminophen-codeine (TYLENOL #3) 300-30 MG per tablet Take 1 tablet by mouth every 4 (four) hours as needed. 60 tablet 0   ALPRAZolam (XANAX) 0.25 MG tablet Take by mouth.     aspirin EC 81 MG tablet Take by mouth.     carvedilol  (COREG) 3.125 MG tablet Take 3.125 mg by mouth 2 (two) times daily.     ferrous sulfate 325 (65 FE) MG tablet Take 325 mg by mouth daily as needed (for iron).     fluticasone (FLONASE) 50 MCG/ACT nasal spray Place 2 sprays into the nose daily. 16 g 11   isosorbide mononitrate (IMDUR) 30 MG 24 hr tablet Take 1 tablet (30 mg total) by mouth daily. 30 tablet 3   lisinopril-hydrochlorothiazide (ZESTORETIC) 10-12.5 MG tablet Take 1 tablet by mouth daily.     nitroGLYCERIN (NITROSTAT) 0.3 MG SL tablet Place under the tongue.     pantoprazole (PROTONIX) 40 MG tablet Take 1 tablet (40 mg total) by mouth daily. 30 tablet 0   polyvinyl alcohol (LIQUIFILM TEARS) 1.4 % ophthalmic solution Place 1 drop into both eyes daily as needed (for dry eyes).     pravastatin (PRAVACHOL) 20 MG tablet SMARTSIG:1 Tablet(s) By Mouth Every Evening     QUEtiapine (SEROQUEL) 100 MG tablet Take 100 mg by mouth at bedtime.     ranitidine (ZANTAC) 150 MG tablet Take 1 tablet (150 mg total) by mouth 2 (two) times daily. 60 tablet 3   sertraline (ZOLOFT) 100 MG tablet Take 1 tablet (100 mg total) by mouth daily at 12 noon. 30 tablet 5   traZODone (DESYREL) 100 MG tablet Take 1 tablet (100 mg total) by mouth at bedtime as needed for sleep. 90 tablet 3   Vitamin D, Ergocalciferol, (DRISDOL) 50000 UNITS CAPS capsule Take 1 capsule (50,000 Units total) by mouth every 7 (seven) days. 10 capsule 0   lisinopril (PRINIVIL,ZESTRIL) 40 MG tablet Take 1 tablet (40 mg total) by mouth daily. (Patient not taking: Reported on 10/06/2022) 30 tablet 3   No facility-administered medications prior to visit.   Review of Systems  Review of Systems  Constitutional:  Positive for fatigue.  HENT: Negative.    Respiratory: Negative.    Psychiatric/Behavioral:  Positive for sleep disturbance.     Physical Exam  BP (!) 144/84 (BP Location: Left Arm, Cuff Size: Normal)   Pulse 79   Temp 98 F (36.7 C) (Temporal)   Ht 5\' 11"  (1.803 m)   Wt 190 lb  9.6 oz (86.5 kg)   SpO2 99%   BMI 26.58 kg/m  Physical Exam Constitutional:      Appearance: Normal appearance.  HENT:     Head: Normocephalic and atraumatic.     Mouth/Throat:     Mouth: Mucous membranes are moist.     Pharynx: Oropharynx is clear.  Cardiovascular:     Rate and Rhythm: Normal rate and regular rhythm.  Pulmonary:     Effort: Pulmonary effort is normal.     Breath sounds: Normal breath sounds.  Musculoskeletal:        General: Normal range of motion.  Skin:    General: Skin is warm and dry.  Neurological:     General: No focal deficit present.     Mental Status: He is alert and oriented  to person, place, and time. Mental status is at baseline.  Psychiatric:        Mood and Affect: Mood normal.        Behavior: Behavior normal.        Thought Content: Thought content normal.        Judgment: Judgment normal.      Lab Results:  CBC    Component Value Date/Time   WBC 7.6 02/09/2016 1637   RBC 4.93 02/09/2016 1637   HGB 8.4 (L) 02/09/2016 1637   HCT 29.4 (L) 02/09/2016 1637   PLT 150 02/09/2016 1637   MCV 59.6 (L) 02/09/2016 1637   MCH 17.0 (L) 02/09/2016 1637   MCHC 28.6 (L) 02/09/2016 1637   RDW 21.3 (H) 02/09/2016 1637   LYMPHSABS 1.2 08/05/2013 1530   MONOABS 0.5 08/05/2013 1530   EOSABS 0.1 08/05/2013 1530   BASOSABS 0.0 08/05/2013 1530    BMET    Component Value Date/Time   NA 137 02/09/2016 1637   K 3.7 02/09/2016 1637   CL 103 02/09/2016 1637   CO2 26 02/09/2016 1637   GLUCOSE 123 (H) 02/09/2016 1637   BUN 8 02/09/2016 1637   CREATININE 1.06 02/09/2016 1637   CREATININE 0.98 04/24/2013 1136   CALCIUM 9.0 02/09/2016 1637   GFRNONAA >60 02/09/2016 1637   GFRNONAA 84 01/28/2013 1106   GFRAA >60 02/09/2016 1637   GFRAA >89 01/28/2013 1106    BNP No results found for: "BNP"  ProBNP No results found for: "PROBNP"  Imaging: No results found.   Assessment & Plan:   Sleep apnea - Patient has snoring symptoms along with  excessive daytime sleepiness. HST 07/26/22 showed moderate OSA, AHI 17.8/hour with SpO2 low 70% (baseline 92%). Reviewed sleep study results, risk of untreated sleep apnea and treatment options. Recommend patient be started on auto CPAP 5-15cm h20 with mask of choice, patient in agreement with plan. He should aim to wear CPAP nightly min 4-6 hours or longer. Encourage side sleeping position, weight loss and advised against driving if experiencing excessive daytime sleepiness. FU in 8-12 weeks for CPAP compliance.       Glenford Bayley, NP 10/06/2022

## 2022-10-06 NOTE — Patient Instructions (Signed)
Home sleep study showed evidence of moderate obstructive sleep apnea  Due to excessive daytime sleepiness recommend you be started on CPAP  Aim to wear CPAP nightly minimum of 4 hours or longer Work on weight loss efforts Focus on side sleeping position Do not drive if experiencing excessive daytime sleepiness or fatigue  Orders Auto CPAP 5 to 15 cm H2O with mask of choice  Follow-up 8 to 12 weeks with Beth NP for CPAP compliance check  CPAP and BIPAP Information CPAP and BIPAP are methods that use air pressure to keep your airways open and to help you breathe well. CPAP and BIPAP use different amounts of pressure. Your health care provider will tell you whether CPAP or BIPAP would be more helpful for you. CPAP stands for "continuous positive airway pressure." With CPAP, the amount of pressure stays the same while you breathe in (inhale) and out (exhale). BIPAP stands for "bi-level positive airway pressure." With BIPAP, the amount of pressure will be higher when you inhale and lower when you exhale. This allows you to take larger breaths. CPAP or BIPAP may be used in the hospital, or your health care provider may want you to use it at home. You may need to have a sleep study before your health care provider can order a machine for you to use at home. What are the advantages? CPAP or BIPAP can be helpful if you have: Sleep apnea. Chronic obstructive pulmonary disease (COPD). Heart failure. Medical conditions that cause muscle weakness, including muscular dystrophy or amyotrophic lateral sclerosis (ALS). Other problems that cause breathing to be shallow, weak, abnormal, or difficult. CPAP and BIPAP are most commonly used for obstructive sleep apnea (OSA) to keep the airways from collapsing when the muscles relax during sleep. What are the risks? Generally, this is a safe treatment. However, problems may occur, including: Irritated skin or skin sores if the mask does not fit properly. Dry  or stuffy nose or nosebleeds. Dry mouth. Feeling gassy or bloated. Sinus or lung infection if the equipment is not cleaned properly. When should CPAP or BIPAP be used? In most cases, the mask only needs to be worn during sleep. Generally, the mask needs to be worn throughout the night and during any daytime naps. People with certain medical conditions may also need to wear the mask at other times, such as when they are awake. Follow instructions from your health care provider about when to use the machine. What happens during CPAP or BIPAP?  Both CPAP and BIPAP are provided by a small machine with a flexible plastic tube that attaches to a plastic mask that you wear. Air is blown through the mask into your nose or mouth. The amount of pressure that is used to blow the air can be adjusted on the machine. Your health care provider will set the pressure setting and help you find the best mask for you. Tips for using the mask Because the mask needs to be snug, some people feel trapped or closed-in (claustrophobic) when first using the mask. If you feel this way, you may need to get used to the mask. One way to do this is to hold the mask loosely over your nose or mouth and then gradually apply the mask more snugly. You can also gradually increase the amount of time that you use the mask. Masks are available in various types and sizes. If your mask does not fit well, talk with your health care provider about getting a different one.  Some common types of masks include: Full face masks, which fit over the mouth and nose. Nasal masks, which fit over the nose. Nasal pillow or prong masks, which fit into the nostrils. If you are using a mask that fits over your nose and you tend to breathe through your mouth, a chin strap may be applied to help keep your mouth closed. Use a skin barrier to protect your skin as told by your health care provider. Some CPAP and BIPAP machines have alarms that may sound if the  mask comes off or develops a leak. If you have trouble with the mask, it is very important that you talk with your health care provider about finding a way to make the mask easier to tolerate. Do not stop using the mask. There could be a negative impact on your health if you stop using the mask. Tips for using the machine Place your CPAP or BIPAP machine on a secure table or stand near an electrical outlet. Know where the on/off switch is on the machine. Follow instructions from your health care provider about how to set the pressure on your machine and when you should use it. Do not eat or drink while the CPAP or BIPAP machine is on. Food or fluids could get pushed into your lungs by the pressure of the CPAP or BIPAP. For home use, CPAP and BIPAP machines can be rented or purchased through home health care companies. Many different brands of machines are available. Renting a machine before purchasing may help you find out which particular machine works well for you. Your health insurance company may also decide which machine you may get. Keep the CPAP or BIPAP machine and attachments clean. Ask your health care provider for specific instructions. Check the humidifier if you have a dry stuffy nose or nosebleeds. Make sure it is working correctly. Follow these instructions at home: Take over-the-counter and prescription medicines only as told by your health care provider. Ask if you can take sinus medicine if your sinuses are blocked. Do not use any products that contain nicotine or tobacco. These products include cigarettes, chewing tobacco, and vaping devices, such as e-cigarettes. If you need help quitting, ask your health care provider. Keep all follow-up visits. This is important. Contact a health care provider if: You have redness or pressure sores on your head, face, mouth, or nose from the mask or head gear. You have trouble using the CPAP or BIPAP machine. You cannot tolerate wearing the  CPAP or BIPAP mask. Someone tells you that you snore even when wearing your CPAP or BIPAP. Get help right away if: You have trouble breathing. You feel confused. Summary CPAP and BIPAP are methods that use air pressure to keep your airways open and to help you breathe well. If you have trouble with the mask, it is very important that you talk with your health care provider about finding a way to make the mask easier to tolerate. Do not stop using the mask. There could be a negative impact to your health if you stop using the mask. Follow instructions from your health care provider about when to use the machine. This information is not intended to replace advice given to you by your health care provider. Make sure you discuss any questions you have with your health care provider. Document Revised: 08/26/2020 Document Reviewed: 12/27/2019 Elsevier Patient Education  2023 [employer].

## 2022-10-06 NOTE — Assessment & Plan Note (Signed)
-   Patient has snoring symptoms along with excessive daytime sleepiness. HST 07/26/22 showed moderate OSA, AHI 17.8/hour with SpO2 low 70% (baseline 92%). Reviewed sleep study results, risk of untreated sleep apnea and treatment options. Recommend patient be started on auto CPAP 5-15cm h20 with mask of choice, patient in agreement with plan. He should aim to wear CPAP nightly min 4-6 hours or longer. Encourage side sleeping position, weight loss and advised against driving if experiencing excessive daytime sleepiness. FU in 8-12 weeks for CPAP compliance.

## 2023-01-05 ENCOUNTER — Ambulatory Visit: Payer: Medicaid Other | Admitting: Primary Care

## 2023-01-05 ENCOUNTER — Encounter: Payer: Self-pay | Admitting: Primary Care

## 2023-02-24 ENCOUNTER — Ambulatory Visit: Payer: Medicaid Other | Admitting: Primary Care
